# Patient Record
Sex: Female | Born: 1960 | Hispanic: No | Marital: Single | Smoking: Former smoker
Health system: Southern US, Community
[De-identification: ages and names within clinical notes are randomized; demographics above are authoritative.]

## PROBLEM LIST (undated history)

## (undated) DIAGNOSIS — E119 Type 2 diabetes mellitus without complications: Secondary | ICD-10-CM

## (undated) DIAGNOSIS — N840 Polyp of corpus uteri: Secondary | ICD-10-CM

## (undated) DIAGNOSIS — Z794 Long term (current) use of insulin: Secondary | ICD-10-CM

## (undated) DIAGNOSIS — I10 Essential (primary) hypertension: Secondary | ICD-10-CM

---

## 1980-04-22 HISTORY — PX: APPENDECTOMY: SHX54

## 1987-04-23 HISTORY — PX: CHOLECYSTECTOMY: SHX55

## 2017-07-01 ENCOUNTER — Ambulatory Visit (INDEPENDENT_AMBULATORY_CARE_PROVIDER_SITE_OTHER): Payer: Medicaid Other | Admitting: Family Medicine

## 2017-07-01 ENCOUNTER — Other Ambulatory Visit: Payer: Self-pay

## 2017-07-01 ENCOUNTER — Other Ambulatory Visit (HOSPITAL_COMMUNITY)
Admission: RE | Admit: 2017-07-01 | Discharge: 2017-07-01 | Disposition: A | Discharge status: Home / Self Care | Payer: Medicaid Other | Source: Ambulatory Visit | Attending: Family Medicine | Admitting: Family Medicine

## 2017-07-01 VITALS — BP 128/78 | HR 61 | Temp 98.5°F | Ht 66.5 in | Wt 220.6 lb

## 2017-07-01 DIAGNOSIS — G44219 Episodic tension-type headache, not intractable: Secondary | ICD-10-CM | POA: Diagnosis present

## 2017-07-01 DIAGNOSIS — R519 Headache, unspecified: Secondary | ICD-10-CM | POA: Insufficient documentation

## 2017-07-01 DIAGNOSIS — Z0289 Encounter for other administrative examinations: Secondary | ICD-10-CM | POA: Diagnosis not present

## 2017-07-01 DIAGNOSIS — R51 Headache: Secondary | ICD-10-CM

## 2017-07-01 NOTE — Assessment & Plan Note (Addendum)
Consistent with tension-type headache. Completely resolves with NSAIDs. No headache red flags. - Very low risk based on history and general exam.  Will conduct more full exam next week.  - Continue Ibuprofen and Tylenol as needed - Follow-up in 1 week
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
## 2017-07-01 NOTE — Assessment & Plan Note (Addendum)
Seen today and screening labs performed. Follow-up in 1 week for full physical exam and to review labs with patient in person.

## 2017-07-01 NOTE — Patient Instructions (Signed)
Fue Engineer, drilling conocerte! Para sus dolores de [place], puede continuar tomando ibuprofeno y tylenol segn sea necesario. Hicimos un trabajo de laboratorio hoy. Le haremos regresar en 1 semana para hacer un examen fsico completo y [employer] de esos laboratorios con usted.

## 2017-07-02 ENCOUNTER — Encounter: Payer: Self-pay | Admitting: Family Medicine

## 2017-07-02 LAB — URINE CYTOLOGY ANCILLARY ONLY
Chlamydia: NEGATIVE
Neisseria Gonorrhea: NEGATIVE

## 2017-07-03 LAB — URINE CULTURE

## 2017-07-05 LAB — COMPREHENSIVE METABOLIC PANEL
A/G RATIO: 1.6 (ref 1.2–2.2)
ALBUMIN: 4.4 g/dL (ref 3.5–5.5)
ALK PHOS: 112 IU/L (ref 39–117)
ALT: 29 IU/L (ref 0–32)
AST: 22 IU/L (ref 0–40)
BILIRUBIN TOTAL: 0.5 mg/dL (ref 0.0–1.2)
BUN / CREAT RATIO: 16 (ref 9–23)
BUN: 15 mg/dL (ref 6–24)
CO2: 24 mmol/L (ref 20–29)
CREATININE: 0.93 mg/dL (ref 0.57–1.00)
Calcium: 9.3 mg/dL (ref 8.7–10.2)
Chloride: 104 mmol/L (ref 96–106)
GFR calc Af Amer: 79 mL/min/{1.73_m2} (ref >59)
GFR calc non Af Amer: 68 mL/min/{1.73_m2} (ref >59)
GLOBULIN, TOTAL: 2.7 g/dL (ref 1.5–4.5)
Glucose: 87 mg/dL (ref 65–99)
POTASSIUM: 4.4 mmol/L (ref 3.5–5.2)
SODIUM: 141 mmol/L (ref 134–144)
Total Protein: 7.1 g/dL (ref 6.0–8.5)

## 2017-07-05 LAB — QUANTIFERON-TB GOLD PLUS
QUANTIFERON NIL VALUE: 0.03 [IU]/mL
QUANTIFERON-TB GOLD PLUS: NEGATIVE
QuantiFERON TB1 Ag Value: 0.02 IU/mL
QuantiFERON TB2 Ag Value: 0.02 IU/mL

## 2017-07-05 LAB — CBC WITH DIFFERENTIAL/PLATELET
BASOS: 1 %
Basophils Absolute: 0 10*3/uL (ref 0.0–0.2)
EOS (ABSOLUTE): 0.4 10*3/uL (ref 0.0–0.4)
Eos: 5 %
HEMATOCRIT: 40.3 % (ref 34.0–46.6)
Hemoglobin: 13.4 g/dL (ref 11.1–15.9)
Immature Grans (Abs): 0 10*3/uL (ref 0.0–0.1)
Immature Granulocytes: 0 %
LYMPHS ABS: 2.5 10*3/uL (ref 0.7–3.1)
Lymphs: 35 %
MCH: 29.1 pg (ref 26.6–33.0)
MCHC: 33.3 g/dL (ref 31.5–35.7)
MCV: 87 fL (ref 79–97)
MONOCYTES: 5 %
MONOS ABS: 0.4 10*3/uL (ref 0.1–0.9)
NEUTROS ABS: 3.9 10*3/uL (ref 1.4–7.0)
Neutrophils: 54 %
Platelets: 258 10*3/uL (ref 150–379)
RBC: 4.61 x10E6/uL (ref 3.77–5.28)
RDW: 14.5 % (ref 12.3–15.4)
WBC: 7.2 10*3/uL (ref 3.4–10.8)

## 2017-07-05 LAB — RPR: RPR Ser Ql: NONREACTIVE

## 2017-07-05 LAB — VARICELLA ZOSTER ANTIBODY, IGG: Varicella zoster IgG: 1941 index (ref >165)

## 2017-07-05 LAB — SICKLE CELL SCREEN: Sickle Cell Screen: NEGATIVE

## 2017-07-05 LAB — HIV ANTIBODY (ROUTINE TESTING W REFLEX): HIV SCREEN 4TH GENERATION: NONREACTIVE

## 2017-07-17 ENCOUNTER — Ambulatory Visit: Payer: Self-pay | Admitting: Internal Medicine

## 2017-07-31 ENCOUNTER — Encounter: Payer: Self-pay | Admitting: Internal Medicine

## 2017-07-31 ENCOUNTER — Ambulatory Visit (INDEPENDENT_AMBULATORY_CARE_PROVIDER_SITE_OTHER): Payer: Medicaid Other | Admitting: Internal Medicine

## 2017-07-31 ENCOUNTER — Other Ambulatory Visit (HOSPITAL_COMMUNITY)
Admission: RE | Admit: 2017-07-31 | Discharge: 2017-07-31 | Disposition: A | Discharge status: Home / Self Care | Payer: Medicaid Other | Source: Ambulatory Visit | Attending: Family Medicine | Admitting: Family Medicine

## 2017-07-31 ENCOUNTER — Other Ambulatory Visit: Payer: Self-pay

## 2017-07-31 VITALS — BP 130/78 | HR 78 | Temp 97.9°F | Wt 220.0 lb

## 2017-07-31 DIAGNOSIS — Z124 Encounter for screening for malignant neoplasm of cervix: Secondary | ICD-10-CM | POA: Diagnosis present

## 2017-07-31 DIAGNOSIS — K5900 Constipation, unspecified: Secondary | ICD-10-CM

## 2017-07-31 MED ORDER — SENNOSIDES-DOCUSATE SODIUM 8.6-50 MG PO TABS: 1.0000 | ORAL_TABLET | Freq: Two times a day (BID) | ORAL | 0 refills | Status: DC

## 2017-07-31 NOTE — Patient Instructions (Addendum)
Fue un Oceanographer. Hicimos tu prueba de [place]apan[place] Surveyor, mining) y te llamar con [employer]  Le recet un medicamento llamado Senokot-S para ayudarlo con sus deposiciones. Debe tomar 1 comprimido dos veces al da.   -Dr. Brett Albino

## 2017-07-31 NOTE — Progress Notes (Signed)
   [facility] Phone: [phone]  Subjective:  Tracie Burton is a 57 year old female presenting to clinic for pap and to discuss constipation.  Pap: Last pap smear was 4-5 years ago in [place]. Has never had an abnormal pap smear per patient. No vaginal discharge. Not sexually active.  Constipation: Has been going on for a couple of weeks. She is only having 1-2 BMs per week currently. She feels bloated and is having some lower abdominal "crampy" pain. She endorses straining. No hematochezia.  ROS: See HPI for pertinent positives and negatives  Past Medical History- hx headaches  Family history reviewed for today's visit. No changes.  Social history- recent refugee, patient is a former smoker  Objective: BP 130/78   Pulse 78   Temp 97.9 F (36.6 C) (Oral)   Wt 220 lb (99.8 kg)   SpO2 94%   BMI 34.98 kg/m  Gen: NAD, alert, cooperative with exam GI: soft, +mild tenderness in the LLQ, BS present, no guarding or organomegaly GU: External genitalia normal in appearance, no genital lesions, vaginal walls normal, small amount of white vaginal discharge present, cervix normal, no CMT, bimanual exam without any adnexal masses or tenderness  Assessment/Plan: Encounter for Pap: Pap performed today. Will call patient with results.  Constipation: Only having 1-2 BMs per day. No vomiting to suggest obstruction. No severe pain, rebound, or guarding to suggest serious intra-abdominal pathology. - Start Senokot-S bid, per patient request - Return precautions discussed - Follow-up if no improvement   Hyman Bible, MD PGY-3

## 2017-08-01 DIAGNOSIS — Z124 Encounter for screening for malignant neoplasm of cervix: Secondary | ICD-10-CM | POA: Insufficient documentation

## 2017-08-01 DIAGNOSIS — K5900 Constipation, unspecified: Secondary | ICD-10-CM | POA: Insufficient documentation

## 2017-08-01 LAB — CYTOLOGY - PAP
Adequacy: ABSENT
DIAGNOSIS: NEGATIVE
HPV (WINDOPATH): NOT DETECTED

## 2017-08-01 NOTE — Assessment & Plan Note (Signed)
Only having 1-2 BMs per day. No vomiting to suggest obstruction. No severe pain, rebound, or guarding to suggest serious intra-abdominal pathology. - Start Senokot-S bid, per patient request - Return precautions discussed - Follow-up if no improvement

## 2017-08-01 NOTE — Assessment & Plan Note (Signed)
Pap performed today. Will call patient with results.

## 2017-08-04 ENCOUNTER — Encounter: Payer: Self-pay | Admitting: *Deleted

## 2019-05-05 ENCOUNTER — Ambulatory Visit (INDEPENDENT_AMBULATORY_CARE_PROVIDER_SITE_OTHER): Payer: BC Managed Care – PPO

## 2019-05-05 ENCOUNTER — Other Ambulatory Visit: Payer: Self-pay

## 2019-05-05 ENCOUNTER — Ambulatory Visit (INDEPENDENT_AMBULATORY_CARE_PROVIDER_SITE_OTHER): Payer: BC Managed Care – PPO | Admitting: Family

## 2019-05-05 ENCOUNTER — Encounter: Payer: Self-pay | Admitting: Family

## 2019-05-05 VITALS — BP 132/78 | HR 67 | Temp 98.0°F | Ht 66.5 in | Wt 249.6 lb

## 2019-05-05 DIAGNOSIS — Z13220 Encounter for screening for lipoid disorders: Secondary | ICD-10-CM

## 2019-05-05 DIAGNOSIS — M549 Dorsalgia, unspecified: Secondary | ICD-10-CM

## 2019-05-05 DIAGNOSIS — R5383 Other fatigue: Secondary | ICD-10-CM

## 2019-05-05 DIAGNOSIS — M542 Cervicalgia: Secondary | ICD-10-CM | POA: Diagnosis not present

## 2019-05-05 DIAGNOSIS — R102 Pelvic and perineal pain unspecified side: Secondary | ICD-10-CM

## 2019-05-05 DIAGNOSIS — Z1231 Encounter for screening mammogram for malignant neoplasm of breast: Secondary | ICD-10-CM | POA: Diagnosis not present

## 2019-05-05 DIAGNOSIS — M545 Low back pain: Secondary | ICD-10-CM | POA: Diagnosis not present

## 2019-05-05 DIAGNOSIS — Z1211 Encounter for screening for malignant neoplasm of colon: Secondary | ICD-10-CM

## 2019-05-05 LAB — COMPREHENSIVE METABOLIC PANEL
ALT: 30 U/L (ref 0–35)
AST: 24 U/L (ref 0–37)
Albumin: 4.5 g/dL (ref 3.5–5.2)
Alkaline Phosphatase: 112 U/L (ref 39–117)
BUN: 15 mg/dL (ref 6–23)
CO2: 26 mEq/L (ref 19–32)
Calcium: 9.3 mg/dL (ref 8.4–10.5)
Chloride: 104 mEq/L (ref 96–112)
Creatinine, Ser: 0.76 mg/dL (ref 0.40–1.20)
GFR: 77.85 mL/min (ref >60.00)
Glucose, Bld: 133 mg/dL — ABNORMAL HIGH (ref 70–99)
Potassium: 4 mEq/L (ref 3.5–5.1)
Sodium: 139 mEq/L (ref 135–145)
Total Bilirubin: 0.7 mg/dL (ref 0.2–1.2)
Total Protein: 7.6 g/dL (ref 6.0–8.3)

## 2019-05-05 LAB — CBC WITH DIFFERENTIAL/PLATELET
Basophils Absolute: 0 10*3/uL (ref 0.0–0.1)
Basophils Relative: 0.3 % (ref 0.0–3.0)
Eosinophils Absolute: 0.5 10*3/uL (ref 0.0–0.7)
Eosinophils Relative: 5.8 % — ABNORMAL HIGH (ref 0.0–5.0)
HCT: 41.1 % (ref 36.0–46.0)
Hemoglobin: 13.5 g/dL (ref 12.0–15.0)
Lymphocytes Relative: 22.4 % (ref 12.0–46.0)
Lymphs Abs: 2.1 10*3/uL (ref 0.7–4.0)
MCHC: 32.7 g/dL (ref 30.0–36.0)
MCV: 88.8 fl (ref 78.0–100.0)
Monocytes Absolute: 0.4 10*3/uL (ref 0.1–1.0)
Monocytes Relative: 4.3 % (ref 3.0–12.0)
Neutro Abs: 6.2 10*3/uL (ref 1.4–7.7)
Neutrophils Relative %: 67.2 % (ref 43.0–77.0)
Platelets: 269 10*3/uL (ref 150.0–400.0)
RBC: 4.63 Mil/uL (ref 3.87–5.11)
RDW: 14.1 % (ref 11.5–15.5)
WBC: 9.2 10*3/uL (ref 4.0–10.5)

## 2019-05-05 LAB — LIPID PANEL
Cholesterol: 197 mg/dL (ref 0–200)
HDL: 60.1 mg/dL (ref >39.00)
LDL Cholesterol: 109 mg/dL — ABNORMAL HIGH (ref 0–99)
NonHDL: 136.81
Total CHOL/HDL Ratio: 3
Triglycerides: 137 mg/dL (ref 0.0–149.0)
VLDL: 27.4 mg/dL (ref 0.0–40.0)

## 2019-05-05 LAB — TSH: TSH: 0.92 u[IU]/mL (ref 0.35–4.50)

## 2019-05-05 LAB — VITAMIN D 25 HYDROXY (VIT D DEFICIENCY, FRACTURES): VITD: 23.31 ng/mL — ABNORMAL LOW (ref 30.00–100.00)

## 2019-05-05 MED ORDER — MELOXICAM 15 MG PO TABS: 15.0000 mg | ORAL_TABLET | Freq: Every day | ORAL | 0 refills | Status: DC

## 2019-05-05 NOTE — Progress Notes (Signed)
Tracie Burton is a 59 y.o. female with the following history as recorded in EpicCare:  Patient Active Problem List   Diagnosis Date Noted  . Screening for cervical cancer 08/01/2017  . Constipation 08/01/2017  . Headache 07/01/2017  . Refugee health examination 07/01/2017    Current Outpatient Medications  Medication Sig Dispense Refill  . meloxicam (MOBIC) 15 MG tablet Take 1 tablet (15 mg total) by mouth daily. 30 tablet 0  . senna-docusate (SENOKOT S) 8.6-50 MG tablet Take 1 tablet by mouth 2 (two) times daily. 180 tablet 0   No current facility-administered medications for this visit.    Allergies: Patient has no known allergies.  History reviewed. No pertinent past medical history.  Past Surgical History:  Procedure Laterality Date  . [place]  . CHOLECYSTECTOMY  1989    History reviewed. No pertinent family history.  Social History   Tobacco Use  . Smoking status: Former Research scientist (life sciences)  . Smokeless tobacco: Never Used  Substance Use Topics  . Alcohol use: Not on file    Subjective:  Patient presents today as a new patient; patient does not speak English/ speaks Spanish; visit is completed with assistance of translator; Complaining of upper back and lower back pain x 1 month; no numbness or tingling; job is physical in nature- works at [employer]; no known injury; Also complaining of pelvic pain- "feels like her stomach is very bloated." No vaginal spotting or bleeding; Last pap smear in 2019- normal; went through menopause at age 74; notes that appetite has been normal- has gained 30 pounds in 2 years; + chronic constipation but this is "not new for patient"- takes Senokot daily;  Overdue for mammogram; never had colonoscopy;   Objective:  Vitals:   05/05/19 1147  BP: 132/78  Pulse: 67  Temp: 98 F (36.7 C)  TempSrc: Oral  SpO2: 97%  Weight: 249 lb 9.6 oz (113.2 kg)  Height: 5' 6.5" (1.689 m)    General: Well developed, well  nourished, in no acute distress  Skin : Warm and dry.  Head: Normocephalic and atraumatic  Eyes: Sclera and conjunctiva clear; pupils round and reactive to light; extraocular movements intact  Ears: External normal; canals clear; tympanic membranes normal  Oropharynx: Pink, supple. No suspicious lesions  Neck: Supple without thyromegaly, adenopathy  Lungs: Respirations unlabored; clear to auscultation bilaterally without wheeze, rales, rhonchi  CVS exam: normal rate and regular rhythm.  Abdomen: Soft; nontender; nondistended; normoactive bowel sounds; no masses or hepatosplenomegaly  Musculoskeletal: No deformities; no active joint inflammation  Extremities: No edema, cyanosis, clubbing  Vessels: Symmetric bilaterally  Neurologic: Alert and oriented; speech intact; face symmetrical; moves all extremities well; CNII-XII intact without focal deficit   Assessment:  1. Back pain, unspecified back location, unspecified back pain laterality, unspecified chronicity   2. Screening mammogram, encounter for   3. Pelvic pain   4. Encounter for screening colonoscopy   5. Other fatigue   6. Lipid screening     Plan:  1. Update cervical and lumbar X-ray today; trial of Mobic 15 mg daily; may need to refer to sports medicine; 2. Order updated; 3. Order for pelvic ultrasound updated; pap smear up to date as of 2019; 4. Order updated; 5. Check labs including CBC, CMP, TSH, Vitamin D; 6. Check lipid panel today;   This visit occurred during the SARS-CoV-2 public health emergency.  Safety protocols were in place, including screening questions prior to the visit, additional usage of  staff PPE, and extensive cleaning of exam room while observing appropriate contact time as indicated for disinfecting solutions.     No follow-ups on file.  Orders Placed This Encounter  Procedures  . MM Digital Screening    Standing Status:   Future    Standing Expiration Date:   07/02/2020    Order Specific  Question:   Reason for Exam (SYMPTOM  OR DIAGNOSIS REQUIRED)    Answer:   screening mammogram    Order Specific Question:   Is the patient pregnant?    Answer:   No    Order Specific Question:   Preferred imaging location?    Answer:   [facility]  . DG Cervical Spine 2 or 3 views    Order Specific Question:   Reason for Exam (SYMPTOM  OR DIAGNOSIS REQUIRED)    Answer:   neck pain    Order Specific Question:   Is patient pregnant?    Answer:   No    Order Specific Question:   Preferred imaging location?    Answer:   [facility]    Order Specific Question:   Radiology Contrast Protocol - do NOT remove file path    Answer:   \\[email or web address]\DXFluoroContrastProtocols.pdf  . DG Lumbar Spine 2-3 Views    Order Specific Question:   Reason for Exam (SYMPTOM  OR DIAGNOSIS REQUIRED)    Answer:   low back pain    Order Specific Question:   Is patient pregnant?    Answer:   No    Order Specific Question:   Preferred imaging location?    Answer:   [facility]    Order Specific Question:   Radiology Contrast Protocol - do NOT remove file path    Answer:   \\[email or web address]\Radiant\DXFluoroContrastProtocols.pdf  . US Pelvic Complete With Transvaginal    Standing Status:   Future    Standing Expiration Date:   07/02/2020    Order Specific Question:   Reason for Exam (SYMPTOM  OR DIAGNOSIS REQUIRED)    Answer:   pelvic pain    Order Specific Question:   Preferred imaging location?    Answer:   [facility]  . CBC w/Diff  . Comp Met (CMET)  . Lipid panel  . TSH  . Vitamin D (25 hydroxy)  . Ambulatory referral to Gastroenterology    Referral Priority:   Routine    Referral Type:   Consultation    Referral Reason:   Specialty Services Required    Number of Visits Requested:   1    Requested Prescriptions   Signed Prescriptions Disp Refills  . meloxicam (MOBIC) 15 MG tablet 30 tablet 0    Sig: Take 1 tablet (15 mg total) by mouth daily.

## 2019-05-06 NOTE — Progress Notes (Signed)
Recommended to sports med

## 2019-05-18 ENCOUNTER — Encounter: Payer: Self-pay | Admitting: Family

## 2019-05-28 ENCOUNTER — Ambulatory Visit
Admission: RE | Admit: 2019-05-28 | Discharge: 2019-05-28 | Disposition: A | Discharge status: Home / Self Care | Payer: BC Managed Care – PPO | Source: Ambulatory Visit | Attending: Family | Admitting: Family

## 2019-05-28 DIAGNOSIS — N840 Polyp of corpus uteri: Secondary | ICD-10-CM | POA: Diagnosis not present

## 2019-05-28 DIAGNOSIS — R102 Pelvic and perineal pain: Secondary | ICD-10-CM

## 2019-05-31 ENCOUNTER — Other Ambulatory Visit: Payer: Self-pay | Admitting: Family

## 2019-05-31 DIAGNOSIS — N840 Polyp of corpus uteri: Secondary | ICD-10-CM

## 2019-05-31 DIAGNOSIS — R102 Pelvic and perineal pain: Secondary | ICD-10-CM

## 2019-06-15 ENCOUNTER — Encounter: Payer: Self-pay | Admitting: Family

## 2019-06-23 ENCOUNTER — Encounter: Payer: Self-pay | Admitting: Gastroenterology

## 2019-06-28 ENCOUNTER — Other Ambulatory Visit: Payer: Self-pay

## 2019-06-28 ENCOUNTER — Ambulatory Visit
Admission: RE | Admit: 2019-06-28 | Discharge: 2019-06-28 | Disposition: A | Discharge status: Home / Self Care | Payer: BC Managed Care – PPO | Source: Ambulatory Visit | Attending: Family | Admitting: Family

## 2019-06-28 DIAGNOSIS — Z1231 Encounter for screening mammogram for malignant neoplasm of breast: Secondary | ICD-10-CM

## 2019-07-19 ENCOUNTER — Ambulatory Visit: Payer: BC Managed Care – PPO | Attending: Internal Medicine

## 2019-07-19 DIAGNOSIS — Z23 Encounter for immunization: Secondary | ICD-10-CM

## 2019-07-19 NOTE — Progress Notes (Signed)
   Covid-19 Vaccination Clinic  Name:  Paylin Heavin    MRN: [id number] DOB: 03/07/1961  07/19/2019  Ms. Arleny Ramp was observed post Covid-19 immunization for 15 minutes without incident. She was provided with Vaccine Information Sheet and instruction to access the V-Safe system.   Ms. Nobuko Proto was instructed to call 911 with any severe reactions post vaccine: Marland Kitchen Difficulty breathing  . Swelling of face and throat  . A fast heartbeat  . A bad rash all over body  . Dizziness and weakness   Immunizations Administered    Name Date Dose VIS Date Route   JANSSEN COVID-19 VACCINE 07/19/2019  1:32 PM 0.5 mL 06/19/2019 Intramuscular   Manufacturer: Alphonsa Overall   Lot: [id number]   [place]: [phone]

## 2019-07-20 ENCOUNTER — Encounter: Payer: Self-pay | Admitting: Obstetrics and Gynecology

## 2019-07-20 ENCOUNTER — Other Ambulatory Visit: Payer: Self-pay

## 2019-07-20 ENCOUNTER — Ambulatory Visit: Payer: BC Managed Care – PPO | Admitting: Obstetrics and Gynecology

## 2019-07-20 VITALS — BP 122/78 | Ht 66.0 in | Wt 249.0 lb

## 2019-07-20 DIAGNOSIS — R1030 Lower abdominal pain, unspecified: Secondary | ICD-10-CM | POA: Diagnosis not present

## 2019-07-20 DIAGNOSIS — R140 Abdominal distension (gaseous): Secondary | ICD-10-CM | POA: Diagnosis not present

## 2019-07-20 DIAGNOSIS — K5900 Constipation, unspecified: Secondary | ICD-10-CM | POA: Diagnosis not present

## 2019-07-20 DIAGNOSIS — N898 Other specified noninflammatory disorders of vagina: Secondary | ICD-10-CM

## 2019-07-20 DIAGNOSIS — N9489 Other specified conditions associated with female genital organs and menstrual cycle: Secondary | ICD-10-CM | POA: Diagnosis not present

## 2019-07-20 NOTE — Patient Instructions (Signed)
por favor programe una cita para regresar a la clnica cuando haya revisado su horario de [place]

## 2019-07-20 NOTE — Progress Notes (Signed)
Tracie Burton  12-08-1960 [id number]  HPI The patient is a 59 y.o. [id number] who presents today for findings of a possible endometrial polyp (2 x 2 x 3 mm) with a 3 mm endometrial thickness on recent elective ultrasound from 05/28/2019.  Bilateral ovaries were not visualized.  There is trace pelvic free fluid, no adnexal masses appreciated.  She was sent for the ultrasound at her primary care provider due to pelvic pain, bloating, and vaginal discharge.  She has had a creamy white vaginal discharge with a lot of moisture noted in her undergarments in the past 3-4 months.  Occasionally has vaginal itching and irritation.  Her bloating and lower abdominal pain has been present in the last 3 months or so.  She feels as though her stomach is moving constantly with rumbling and internal movement causing some discomfort.  Tylenol helps a little, bloating and discomfort is a bit relieved by passing gas, resting, or having a bowel movement.  She is not having a daily bowel movement, she does find help with constipation by using Senokot.  The bloating bloating and discomfort do not have any relation to eating.  She denies any vaginal bleeding or recurrent vaginal discharge since menopause started around age 29.  She not use any hormones in postmenopausal.  She denies any prior abnormal Pap smears.  2 vaginal deliveries, 3 cesarean sections (1 child unfortunately passed away). Spanish interpreter present for the visit today.  Past medical history,surgical history, problem list, medications, allergies, family history and social history were all reviewed and documented as reviewed in the EPIC chart.  ROS:  Feeling well. No dyspnea or chest pain on exertion.  No abdominal pain, change in bowel habits, black or bloody stools.  No urinary tract symptoms. GYN ROS: no abnormal bleeding, pelvic pain, +vaginal discharge.  Physical Exam  BP 122/78   Ht 5\' 6"  (1.676 m)   Wt 249 lb (112.9 kg)   BMI 40.19 kg/m    General: Pleasant female, no acute distress, alert and oriented PELVIC EXAM: Deferred to future visit per patient request  Pelvic ultrasound images were independently reviewed.  Assessment 59 yo [id number] menopausal female with incidental finding of a possible endometrial polyp, vaginal discharge, and abdominal bloating  Plan I used diagrams to illustrate the finding of the endometrial polyp.  Endometrial polyps do not cause abdominal pain or bloating unless they are related to a larger global condition (e.g. malignancy).  It does sound like she has some bowel dysfunction and a constipation issue, so I think it is more likely that her bloating is related to GI causes than anything gynecologic.  She has not had any vaginal bleeding and her endometrial thickness was only 3 mm, but she did have a significant amount of endometrial fluid present on the imaging.  We discussed that her ovaries are not well visualized on the ultrasound.  There was only trace pelvic free fluid.  Will try to get a sense on pelvic exam to see if there is any sign of adnexal fullness or tenderness.  We certainly will plan to do the pelvic examination at a future date, but I would also recommend performing endometrial biopsy since she is noted to have the polyp and endometrial fluid on ultrasound, even though she has not had postmenopausal bleeding.  Since the polyp was discovered it would be reasonable to perform the biopsy just to give more reassurance that the endometrial tissue is benign, and I explained that blind endometrial biopsy does  not reliably sample polyp tissue, but it is a good happy medium between no further evaluation and performing a full D&C.  It also would be good to perform a vaginal wet prep since she is having vaginal discharge.  We will can do this all at the same time.  She preferred to first look at her work schedule so she can return another day to have the pelvic exam and endometrial biopsy performed.  I  recommend taking 600 mg ibuprofen prior to the procedure.  She will let us know when she can return for that visit.  All questions were answered by the end of the visit.  Joseph Pierini MD, FACOG 07/20/19

## 2019-07-27 ENCOUNTER — Encounter: Payer: BC Managed Care – PPO | Admitting: Gastroenterology

## 2019-08-31 ENCOUNTER — Encounter: Payer: Self-pay | Admitting: Obstetrics and Gynecology

## 2019-08-31 ENCOUNTER — Other Ambulatory Visit: Payer: Self-pay

## 2019-08-31 ENCOUNTER — Ambulatory Visit (INDEPENDENT_AMBULATORY_CARE_PROVIDER_SITE_OTHER): Payer: BC Managed Care – PPO | Admitting: Obstetrics and Gynecology

## 2019-08-31 VITALS — BP 120/76

## 2019-08-31 DIAGNOSIS — R1030 Lower abdominal pain, unspecified: Secondary | ICD-10-CM

## 2019-08-31 DIAGNOSIS — N859 Noninflammatory disorder of uterus, unspecified: Secondary | ICD-10-CM | POA: Diagnosis not present

## 2019-08-31 DIAGNOSIS — N898 Other specified noninflammatory disorders of vagina: Secondary | ICD-10-CM | POA: Diagnosis not present

## 2019-08-31 DIAGNOSIS — N9489 Other specified conditions associated with female genital organs and menstrual cycle: Secondary | ICD-10-CM | POA: Diagnosis not present

## 2019-08-31 LAB — WET PREP FOR TRICH, YEAST, CLUE

## 2019-08-31 NOTE — Addendum Note (Signed)
Addended by: Joseph Pierini D on: 08/31/2019 04:36 PM   Modules accepted: Orders

## 2019-08-31 NOTE — Progress Notes (Signed)
Tracie Burton 06/11/1959 [id number]  SUBJECTIVE:  59 y.o. [id number] female presents for an endometrial biopsy and follow-up to a pelvic ultrasound that was performed 05/28/2019 for pelvic pain, bloating, and vaginal discharge.  That ultrasound indicated she had a 3 mm endometrial thickness with a 3 mm endometrial polyp.  Her ovaries were not visualized.  She had endometrial fluid present, and she also complains of a fair amount of clearish vaginal fluid that causes moisture in her undergarments.  Returns today for a pelvic exam and endometrial biopsy due to the ultrasound findings.  She has not had any vaginal bleeding at any point.  A professional Spanish interpreter is present for today's encounter.  Current Outpatient Medications  Medication Sig Dispense Refill  . Multiple Vitamin (MULTIVITAMIN) tablet Take 1 tablet by mouth daily.     No current facility-administered medications for this visit.   Allergies: Patient has no known allergies.  No LMP recorded. Patient is postmenopausal.  Past medical history,surgical history, problem list, medications, allergies, family history and social history were all reviewed and documented as reviewed in the EPIC chart.  ROS:  Feeling well. No dyspnea or chest pain on exertion.  No abdominal pain, change in bowel habits, black or bloody stools.  No urinary tract symptoms. GYN ROS: As described in HPI   OBJECTIVE:  BP 120/76  The patient appears well, alert, oriented x 3, in no distress. PELVIC EXAM: VULVA: normal appearing vulva with no masses, tenderness or lesions, atrophic changes noted, VAGINA: Long Peterson speculum worked best to view the cervix, normal appearing vagina with normal color and discharge, no lesions, CERVIX: normal appearing cervix without discharge or lesions, UTERUS: uterus is normal size, shape, consistency and nontender, ADNEXA: normal adnexa in size, nontender and no masses, WET MOUNT done - results: negative for  pathogens, normal epithelial cells, few bacteria   Endometrial Biopsy Procedure Note Tracie Burton 16-Jun-1959 [id number] The cervix was cleansed with a Betadine swab x1.  The anterior lip of the cervix was grasped with an Allis clamp.  There was some stenosis to the external cervical os which was easily released with the cervical os finder.  The endometrial Pipelle sampler was easily introduced into the endometrial cavity and sound length measurement was taken (8 cm).  The plunger was withdrawn causing suction in the Pipelle and the device was moved about the cavity for about 10 sec.  The Pipelle was removed and the first sample only contained a cloudy, whitish/light yellow nonpurulent fluid.  The sample was emptied in a specimen cup, maintaining sterility.  The Pipelle was reintroduced into the endometrial cavity to collect a scant amount of endometrial tissue sample.  The specimen contents were collected and sent to pathology for analysis.  The patient tolerated the procedure well without any complication.  Caryn Bee present during the procedure and examination   ASSESSMENT:  59 y.o. [id number] here for endometrial biopsy for suspected endometrial polyp, also with ongoing clear vaginal discharge  PLAN:  Vaginal wet mount: Negative. Endometrial biopsy was collected today without difficulty.  We will let her know the results of today's biopsy when pathology report is available.  She had a normal Pap smear 07/2017. Using diagrams, I explained that she had a fair amount of endometrial fluid present which was drained by performing today's procedure which did release the scarring of the external cervical os.  This may fix her problem of the vaginal discharge that she has noticed.  I reassured her that she had  just a scant amount of endometrium present in the sample and therefore I do not suspect that she has any abnormal process going on in the uterus.  She should take ibuprofen and Tylenol for  cramping.  All questions were answered by the end of the visit.  Joseph Pierini MD 08/31/19

## 2019-08-31 NOTE — Patient Instructions (Signed)
Take ibuprofen for cramping 400-600 mg with food every 6 hours as needed Take acetaminophen for pain [phone] mg every 6 hours as needed We will call you with your biopsy results

## 2019-09-02 LAB — TISSUE SPECIMEN

## 2019-09-02 LAB — PATHOLOGY REPORT

## 2019-09-02 NOTE — Progress Notes (Signed)
Please let Seleta know that the endometrial biopsy showed normal tissue, no sign of precancerous or cancer tissue.  Should let us know if she does at any point develop vaginal bleeding.

## 2019-10-08 ENCOUNTER — Ambulatory Visit: Payer: BC Managed Care – PPO | Admitting: Family

## 2019-10-08 ENCOUNTER — Other Ambulatory Visit: Payer: Self-pay

## 2019-10-08 ENCOUNTER — Encounter: Payer: Self-pay | Admitting: Family

## 2019-10-08 VITALS — BP 136/82 | HR 80 | Temp 98.0°F | Ht 66.0 in | Wt 233.0 lb

## 2019-10-08 DIAGNOSIS — R7309 Other abnormal glucose: Secondary | ICD-10-CM

## 2019-10-08 DIAGNOSIS — B379 Candidiasis, unspecified: Secondary | ICD-10-CM

## 2019-10-08 LAB — HEMOGLOBIN A1C: Hgb A1c MFr Bld: 13.6 % — ABNORMAL HIGH (ref 4.6–6.5)

## 2019-10-08 LAB — CBC WITH DIFFERENTIAL/PLATELET
Basophils Absolute: 0.1 10*3/uL (ref 0.0–0.1)
Basophils Relative: 1 % (ref 0.0–3.0)
Eosinophils Absolute: 0.2 10*3/uL (ref 0.0–0.7)
Eosinophils Relative: 3 % (ref 0.0–5.0)
HCT: 40.1 % (ref 36.0–46.0)
Hemoglobin: 13.3 g/dL (ref 12.0–15.0)
Lymphocytes Relative: 24.9 % (ref 12.0–46.0)
Lymphs Abs: 1.7 10*3/uL (ref 0.7–4.0)
MCHC: 33.3 g/dL (ref 30.0–36.0)
MCV: 88.8 fl (ref 78.0–100.0)
Monocytes Absolute: 0.4 10*3/uL (ref 0.1–1.0)
Monocytes Relative: 5.2 % (ref 3.0–12.0)
Neutro Abs: 4.6 10*3/uL (ref 1.4–7.7)
Neutrophils Relative %: 65.9 % (ref 43.0–77.0)
Platelets: 226 10*3/uL (ref 150.0–400.0)
RBC: 4.51 Mil/uL (ref 3.87–5.11)
RDW: 13.5 % (ref 11.5–15.5)
WBC: 7 10*3/uL (ref 4.0–10.5)

## 2019-10-08 LAB — COMPREHENSIVE METABOLIC PANEL
ALT: 29 U/L (ref 0–35)
AST: 22 U/L (ref 0–37)
Albumin: 4.2 g/dL (ref 3.5–5.2)
Alkaline Phosphatase: 137 U/L — ABNORMAL HIGH (ref 39–117)
BUN: 20 mg/dL (ref 6–23)
CO2: 26 mEq/L (ref 19–32)
Calcium: 9 mg/dL (ref 8.4–10.5)
Chloride: 102 mEq/L (ref 96–112)
Creatinine, Ser: 1.01 mg/dL (ref 0.40–1.20)
GFR: 55.99 mL/min — ABNORMAL LOW (ref >60.00)
Glucose, Bld: 486 mg/dL — ABNORMAL HIGH (ref 70–99)
Potassium: 3.9 mEq/L (ref 3.5–5.1)
Sodium: 134 mEq/L — ABNORMAL LOW (ref 135–145)
Total Bilirubin: 0.8 mg/dL (ref 0.2–1.2)
Total Protein: 6.8 g/dL (ref 6.0–8.3)

## 2019-10-08 MED ORDER — NYSTATIN-TRIAMCINOLONE 100000-0.1 UNIT/GM-% EX CREA: 1.0000 "application " | TOPICAL_CREAM | Freq: Two times a day (BID) | CUTANEOUS | 0 refills | Status: DC

## 2019-10-08 MED ORDER — FLUCONAZOLE 100 MG PO TABS
100.0000 mg | ORAL_TABLET | Freq: Every day | ORAL | 0 refills | Status: DC
Start: 2019-10-08 — End: 2019-10-20

## 2019-10-08 NOTE — Progress Notes (Signed)
  Tracie Burton is a 59 y.o. female with the following history as recorded in EpicCare:  Patient Active Problem List   Diagnosis Date Noted  . Screening for cervical cancer 08/01/2017  . Constipation 08/01/2017  . Headache 07/01/2017  . Refugee health examination 07/01/2017    Current Outpatient Medications  Medication Sig Dispense Refill  . Multiple Vitamin (MULTIVITAMIN) tablet Take 1 tablet by mouth daily.    . fluconazole (DIFLUCAN) 100 MG tablet Take 1 tablet (100 mg total) by mouth daily. 7 tablet 0  . nystatin-triamcinolone (MYCOLOG II) cream Apply 1 application topically 2 (two) times daily. 60 g 0   No current facility-administered medications for this visit.    Allergies: Patient has no known allergies.  History reviewed. No pertinent past medical history.  Past Surgical History:  Procedure Laterality Date  . APPENDECTOMY    . [place]  . CHOLECYSTECTOMY  1989    Family History  Problem Relation Age of Onset  . Breast cancer Mother 79  . Diabetes Father     Social History   Tobacco Use  . Smoking status: Former Research scientist (life sciences)  . Smokeless tobacco: Never Used  Substance Use Topics  . Alcohol use: Never    Subjective:  Accompanied by daughter who serves as Optometrist; complaining of rash in groin area/ vaginal itching; symptoms present x 2-3 weeks; is menopausal- no unexplained bleeding or vaginal discharge; no OTC medications;   Objective:  Vitals:   10/08/19 1515  BP: 136/82  Pulse: 80  Temp: 98 F (36.7 C)  TempSrc: Oral  SpO2: 96%  Weight: 233 lb (105.7 kg)  Height: '5\' 6"'$  (1.676 m)    General: Well developed, well nourished, in no acute distress  Skin : Warm and dry.  Head: Normocephalic and atraumatic  Lungs: Respirations unlabored; Neurologic: Alert and oriented; speech intact; face symmetrical; moves all extremities well; CNII-XII intact without focal deficit  Pelvic exam- erythema noted in inguinal areas c/w  yeast  Assessment:  1. Yeast infection   2. Elevated glucose     Plan:  Rx for Diflucan 100 mg qd x 7 days; Rx for Mycolog II cream- apply bid to affected area; Glucose was elevated at OV in January- will repeat today and check hgba1c- may need to consider trial of Metformin;  This visit occurred during the SARS-CoV-2 public health emergency.  Safety protocols were in place, including screening questions prior to the visit, additional usage of staff PPE, and extensive cleaning of exam room while observing appropriate contact time as indicated for disinfecting solutions.     No follow-ups on file.  Orders Placed This Encounter  Procedures  . HgB A1c  . CBC with Differential/Platelet  . Comp Met (CMET)    Requested Prescriptions   Signed Prescriptions Disp Refills  . fluconazole (DIFLUCAN) 100 MG tablet 7 tablet 0    Sig: Take 1 tablet (100 mg total) by mouth daily.  Marland Kitchen nystatin-triamcinolone (MYCOLOG II) cream 60 g 0    Sig: Apply 1 application topically 2 (two) times daily.

## 2019-10-08 NOTE — Patient Instructions (Addendum)
Infecciones por hongos en la piel Skin Yeast Infection  La infeccin por hongos en la piel es un trastorno en el que hay un desarrollo excesivo de unos hongos (cndida) que viven normalmente en la piel. Por lo general, este tipo de infeccin ocurre en reas de la piel que estn constantemente clidas y hmedas, como las axilas o la ingle. Cules son las causas? La causa de la afeccin es un cambio en el equilibrio normal de los hongos y las bacterias que viven en la piel. Qu incrementa el riesgo? Es ms probable que tenga esta afeccin si:  Tiene obesidad.  Est embarazada.  Toma anticonceptivos orales.  Tiene diabetes.  Toma antibiticos.  Toma medicamentos con corticoesteroides.  Est desnutrido.  Tiene debilitado el sistema de defensa del organismo (sistema inmunitario).  Tiene 65aos o ms.  [place] ropa ajustada. Cules son los signos o los sntomas? El sntoma ms frecuente de esta afeccin es picazn en la zona afectada. Otros sntomas pueden incluir los siguientes:  Zona de la piel roja e hinchada.  Bultos en la piel. Cmo se diagnostica?  Esta afeccin se diagnostica mediante una revisin de los antecedentes mdicos y un examen fsico.  El mdico puede raspar ligeramente la piel para tomar Truddie Coco y analizarla con un microscopio a fin de Office manager presencia de hongos. Cmo se trata? Esta afeccin se trata con medicamentos. [place] pueden ser de venta libre o con receta. Estos medicamentos pueden administrarse de la siguiente manera:  Por boca (va oral).  Mediante aplicacin en la piel, en forma de crema o polvo. Siga estas indicaciones en su casa:   Tome o aplquese los medicamentos de venta libre y los recetados solamente como se lo haya indicado el mdico.  Mantenga un peso saludable. Si necesita ayuda para bajar de peso, hable con el mdico.  Mantenga la piel limpia y [place].  Si tiene diabetes, mantenga bajo control el nivel de  Dispensing optician.  Concurra a todas las visitas de control como se lo haya indicado el mdico. Esto es importante. Comunquese con un mdico si:  Los sntomas desaparecen y luego vuelven a Arts administrator.  Los sntomas no mejoran con Dispensing optician.  Sus sntomas empeoran.  La erupcin se extiende.  Tiene fiebre o escalofros.  Aparecen nuevos sntomas.  Tiene una nueva zona de enrojecimiento o calor en la piel. Resumen  La infeccin por hongos en la piel es un trastorno en el que hay un desarrollo excesivo de unos hongos (cndida) que viven normalmente en la piel. La causa de la afeccin es un cambio en el equilibrio normal de los hongos y las bacterias que viven en la piel.  Tome o aplquese los medicamentos de venta libre y los recetados solamente como se lo haya indicado el mdico.  Mantenga la piel limpia y [place].  Comunquese con un mdico si los sntomas no mejoran con el tratamiento. Esta informacin no tiene Marine scientist el consejo del mdico. Asegrese de hacerle al mdico cualquier pregunta que tenga. Document Revised: 10/16/2017 Document Reviewed: 10/16/2017 Elsevier Patient Education  [place].

## 2019-10-11 ENCOUNTER — Other Ambulatory Visit: Payer: Self-pay | Admitting: Family

## 2019-10-11 DIAGNOSIS — E119 Type 2 diabetes mellitus without complications: Secondary | ICD-10-CM

## 2019-10-11 MED ORDER — METFORMIN HCL 500 MG PO TABS
500.0000 mg | ORAL_TABLET | Freq: Two times a day (BID) | ORAL | 1 refills | Status: DC
Start: 2019-10-11 — End: 2020-04-28

## 2019-10-11 MED ORDER — TRULICITY 0.75 MG/0.5ML ~~LOC~~ SOAJ
0.7500 mg | SUBCUTANEOUS | 0 refills | Status: DC
Start: 2019-10-11 — End: 2019-11-11

## 2019-10-20 ENCOUNTER — Encounter: Payer: Self-pay | Admitting: Family

## 2019-10-20 ENCOUNTER — Other Ambulatory Visit: Payer: Self-pay

## 2019-10-20 ENCOUNTER — Ambulatory Visit: Payer: BC Managed Care – PPO | Admitting: Family

## 2019-10-20 VITALS — BP 126/72 | HR 90 | Temp 98.6°F | Wt 229.4 lb

## 2019-10-20 DIAGNOSIS — E119 Type 2 diabetes mellitus without complications: Secondary | ICD-10-CM

## 2019-10-20 NOTE — Progress Notes (Signed)
  Tracie Burton is a 59 y.o. female with the following history as recorded in EpicCare:  Patient Active Problem List   Diagnosis Date Noted  . Screening for cervical cancer 08/01/2017  . Constipation 08/01/2017  . Headache 07/01/2017  . Refugee health examination 07/01/2017    Current Outpatient Medications  Medication Sig Dispense Refill  . Dulaglutide (TRULICITY) 3.66 YQ/0.3KV SOPN Inject 0.5 mLs (0.75 mg total) into the skin once a week. 4 pen 0  . metFORMIN (GLUCOPHAGE) 500 MG tablet Take 1 tablet (500 mg total) by mouth 2 (two) times daily with a meal. 60 tablet 1  . Multiple Vitamin (MULTIVITAMIN) tablet Take 1 tablet by mouth daily.    Marland Kitchen nystatin-triamcinolone (MYCOLOG II) cream Apply 1 application topically 2 (two) times daily. 60 g 0   No current facility-administered medications for this visit.    Allergies: Patient has no known allergies.  No past medical history on file.  Past Surgical History:  Procedure Laterality Date  . APPENDECTOMY    . [place]  . CHOLECYSTECTOMY  1989    Family History  Problem Relation Age of Onset  . Breast cancer Mother 25  . Diabetes Father     Social History   Tobacco Use  . Smoking status: Former Research scientist (life sciences)  . Smokeless tobacco: Never Used  Substance Use Topics  . Alcohol use: Never    Subjective:  Accompanied by translator; follow-up on recent diagnosis of Type 2 Diabetes; tolerating Metformin well- has been taking bid; needs teaching on how to use Trulicity; Yeast infection has cleared completely that she had at last OV;    Objective:  Vitals:   10/20/19 1301  BP: 126/72  Pulse: 90  Temp: 98.6 F (37 C)  TempSrc: Oral  SpO2: 95%    General: Well developed, well nourished, in no acute distress  Skin : Warm and dry.  Lungs: Respirations unlabored; clear to auscultation bilaterally without wheeze, rales, rhonchi  Neurologic: Alert and oriented; speech intact; face symmetrical; moves  all extremities well; CNII-XII intact without focal deficit   Assessment:  1. Type 2 diabetes mellitus without complication, without long-term current use of insulin (HCC)     Plan:  Non fasting glucose today is 276; improving from last check at 425; Trulicity injection is given in office- patient is comfortable with giving herself her injections going forward; keep Metformin at bid;  She will go to diabetic education in 2 weeks; office visit here in 1 month;  This visit occurred during the SARS-CoV-2 public health emergency.  Safety protocols were in place, including screening questions prior to the visit, additional usage of staff PPE, and extensive cleaning of exam room while observing appropriate contact time as indicated for disinfecting solutions.     Return in about 1 month (around 11/19/2019).  No orders of the defined types were placed in this encounter.   Requested Prescriptions    No prescriptions requested or ordered in this encounter

## 2019-11-03 ENCOUNTER — Encounter: Payer: BC Managed Care – PPO | Attending: Family | Admitting: Nutrition

## 2019-11-03 ENCOUNTER — Other Ambulatory Visit: Payer: Self-pay

## 2019-11-03 DIAGNOSIS — E1165 Type 2 diabetes mellitus with hyperglycemia: Secondary | ICD-10-CM | POA: Diagnosis not present

## 2019-11-04 NOTE — Progress Notes (Signed)
Patient is here with interprter:Mr. Jemendez to review her diet. She is newly diagnosed, and does not test blood sugars.  Says since seeing her  MD, she has reduced portion sizes, stopped drinking sweet drinks, and juices, and has lost weight--says has lost 26 pounds! Diet: Typical day: 5AM:  Coffee with spenda 7AM: at work 2 eggs, with little bit of cheese or oatmeal with fruit and water to drink 2PM: chicken with onions, garlic, pimentoes, tomatoes, and other veg. As she gets them, rice 91cup), potaoes (1/2 cup. Water to drink 4PM: small amount of Panella (9this is a sweet natural vegetable that she eats with small amount of cheese. 6PM: smaller meal of eggs with bread, or small chicken breast sandwich with lettuce and tomatoe, or oatmeal with apple slices  With water to drink 9PM: croissant, or angle food cake slice with water to drink.  Exercise:  None. SBGM: none Discussion 1.  Discussed diabetes-what is happening in her body, and ways she can help decrease insulin resisitace with diet, exercise, and weight loss. 2.  Discussed importance of diet, and balancing meals-making sure there is protein ,carbs and fat in each meal, but not too much of any one.  Sheet given with foods in each group, an told her that she is actually doing this now.  Questioned meal of oatmeal with fruit, and that need for some protein.  Suggestions given for protein choices for this meals 3.  Discussed importance of testing blood sugar to determine if diet/blood sugar rise is handled by body.  Suggested she test around 1 meal each day-testing before and 2hr. After one meal each week.  Goals given for this.  Accucheck guide given with sample of 10 test strips given.  Lot # [id number], exp. Date; 01/11/21.  She redemonstrated how to use this meter, and her blood sugar was 121 fasting.   She was told to call office to send in script for these--50/month.  She agreed to do this.

## 2019-11-04 NOTE — Patient Instructions (Signed)
Test blood sugar around one meal each day--before and 2hr. After finishing the meal- Do one meal--breakfast, lunch and supper each week.  If 2hr. After meal blood sugar is over 180, write down what you ate and bring this with you in one month.   Walk for 20 min. 5 days per week, and increase this as tolerated to 30 min.  Continue currant meal plan, except add once ounce of protein to oatmeal meal.

## 2019-11-11 ENCOUNTER — Other Ambulatory Visit: Payer: Self-pay | Admitting: Family

## 2019-11-15 ENCOUNTER — Ambulatory Visit: Payer: BC Managed Care – PPO | Admitting: Family

## 2019-11-15 ENCOUNTER — Other Ambulatory Visit: Payer: Self-pay

## 2019-11-15 ENCOUNTER — Encounter: Payer: Self-pay | Admitting: Family

## 2019-11-15 VITALS — BP 112/80 | HR 77 | Temp 98.6°F | Wt 225.6 lb

## 2019-11-15 DIAGNOSIS — E119 Type 2 diabetes mellitus without complications: Secondary | ICD-10-CM | POA: Diagnosis not present

## 2019-11-15 NOTE — Patient Instructions (Signed)
Hold the Metformin; just use Trulicity; we'll adjust the dose if needed after I see the labs; plan to 3-4 month follow-up;

## 2019-11-15 NOTE — Addendum Note (Signed)
Addended by: Cresenciano Lick on: 11/15/2019 04:15 PM   Modules accepted: Orders

## 2019-11-15 NOTE — Progress Notes (Signed)
  Tracie Burton is a 59 y.o. female with the following history as recorded in EpicCare:  Patient Active Problem List   Diagnosis Date Noted  . Screening for cervical cancer 08/01/2017  . Constipation 08/01/2017  . Headache 07/01/2017  . Refugee health examination 07/01/2017    Current Outpatient Medications  Medication Sig Dispense Refill  . Multiple Vitamin (MULTIVITAMIN) tablet Take 1 tablet by mouth daily.    Marland Kitchen nystatin-triamcinolone (MYCOLOG II) cream Apply 1 application topically 2 (two) times daily. 60 g 0  . metFORMIN (GLUCOPHAGE) 500 MG tablet Take 1 tablet (500 mg total) by mouth 2 (two) times daily with a meal. (Patient not taking: Reported on 11/15/2019) 60 tablet 1  . TRULICITY 7.82 NF/6.2ZH SOPN ADMINISTER 0.75 MG UNDER THE SKIN 1 TIME A WEEK (Patient not taking: Reported on 11/15/2019) 2 mL 0   No current facility-administered medications for this visit.    Allergies: Patient has no known allergies.  No past medical history on file.  Past Surgical History:  Procedure Laterality Date  . APPENDECTOMY    . [place]  . CHOLECYSTECTOMY  1989    Family History  Problem Relation Age of Onset  . Breast cancer Mother 3  . Diabetes Father     Social History   Tobacco Use  . Smoking status: Former Research scientist (life sciences)  . Smokeless tobacco: Never Used  Substance Use Topics  . Alcohol use: Never    Subjective:   Accompanied by daughter who helps translate today; has lost 24 pounds in the past 4 months; did find her diabetic class very useful; is concerned that her Metformin is causing her chronic nausea; has not had any problems tolerating Trulicity;   Objective:  Vitals:   11/15/19 1541  BP: 112/80  Pulse: 77  Temp: 98.6 F (37 C)  TempSrc: Oral  SpO2: 95%  Weight: (!) 225 lb 9.6 oz (102.3 kg)    General: Well developed, well nourished, in no acute distress  Skin : Warm and dry.  Head: Normocephalic and atraumatic  Lungs:  Respirations unlabored; clear to auscultation bilaterally without wheeze, rales, rhonchi  CVS exam: normal rate and regular rhythm.  Neurologic: Alert and oriented; speech intact; face symmetrical; moves all extremities well; CNII-XII intact without focal deficit   Assessment:  1. Type 2 diabetes mellitus without complication, without long-term current use of insulin (HCC)     Plan:  Hold the Metformin for now; update labs today including CMP, Hgba1c; will plan to continue Trulicity and will adjust dosage if needed based on labs today; will also need to consider starting statin; will discus with patient once labs are back; plan for OV in 3-4 months;  This visit occurred during the SARS-CoV-2 public health emergency.  Safety protocols were in place, including screening questions prior to the visit, additional usage of staff PPE, and extensive cleaning of exam room while observing appropriate contact time as indicated for disinfecting solutions.     No follow-ups on file.  Orders Placed This Encounter  Procedures  . Comp Met (CMET)    Standing Status:   Future    Standing Expiration Date:   11/14/2020  . Hemoglobin A1c    Standing Status:   Future    Standing Expiration Date:   11/14/2020    Requested Prescriptions    No prescriptions requested or ordered in this encounter

## 2019-11-16 ENCOUNTER — Other Ambulatory Visit: Payer: Self-pay | Admitting: Family

## 2019-11-16 LAB — COMPREHENSIVE METABOLIC PANEL
AG Ratio: 2 (calc) (ref 1.0–2.5)
ALT: 24 U/L (ref 6–29)
AST: 24 U/L (ref 10–35)
Albumin: 4.7 g/dL (ref 3.6–5.1)
Alkaline phosphatase (APISO): 111 U/L (ref 37–153)
BUN: 14 mg/dL (ref 7–25)
CO2: 29 mmol/L (ref 20–32)
Calcium: 9.3 mg/dL (ref 8.6–10.4)
Chloride: 105 mmol/L (ref 98–110)
Creat: 0.9 mg/dL (ref 0.50–1.05)
Globulin: 2.4 g/dL (calc) (ref 1.9–3.7)
Glucose, Bld: 100 mg/dL — ABNORMAL HIGH (ref 65–99)
Potassium: 4.6 mmol/L (ref 3.5–5.3)
Sodium: 141 mmol/L (ref 135–146)
Total Bilirubin: 0.7 mg/dL (ref 0.2–1.2)
Total Protein: 7.1 g/dL (ref 6.1–8.1)

## 2019-11-16 LAB — HEMOGLOBIN A1C
Hgb A1c MFr Bld: 11.3 % of total Hgb — ABNORMAL HIGH (ref <5.7)
Mean Plasma Glucose: 278 (calc)
eAG (mmol/L): 15.4 (calc)

## 2019-11-16 MED ORDER — TRULICITY 1.5 MG/0.5ML ~~LOC~~ SOAJ
1.5000 mg | SUBCUTANEOUS | 3 refills | Status: DC
Start: 2019-11-16 — End: 2020-03-24

## 2019-12-14 ENCOUNTER — Other Ambulatory Visit: Payer: Self-pay | Admitting: Family

## 2020-02-21 ENCOUNTER — Ambulatory Visit: Payer: BC Managed Care – PPO | Admitting: Family

## 2020-02-21 DIAGNOSIS — Z0289 Encounter for other administrative examinations: Secondary | ICD-10-CM

## 2020-03-24 ENCOUNTER — Other Ambulatory Visit: Payer: Self-pay | Admitting: Family

## 2020-04-21 ENCOUNTER — Other Ambulatory Visit: Payer: Self-pay | Admitting: Family

## 2020-04-23 NOTE — Telephone Encounter (Signed)
Please call and ask her to schedule OV; we cannot continue to refill her medications without OV.

## 2020-04-28 ENCOUNTER — Other Ambulatory Visit: Payer: Self-pay

## 2020-04-28 ENCOUNTER — Ambulatory Visit: Payer: BC Managed Care – PPO | Admitting: Family

## 2020-04-28 ENCOUNTER — Encounter: Payer: Self-pay | Admitting: Family

## 2020-04-28 VITALS — BP 122/82 | HR 71 | Temp 98.7°F | Wt 205.8 lb

## 2020-04-28 DIAGNOSIS — Z1211 Encounter for screening for malignant neoplasm of colon: Secondary | ICD-10-CM

## 2020-04-28 DIAGNOSIS — E119 Type 2 diabetes mellitus without complications: Secondary | ICD-10-CM | POA: Diagnosis not present

## 2020-04-28 DIAGNOSIS — R5383 Other fatigue: Secondary | ICD-10-CM | POA: Diagnosis not present

## 2020-04-28 LAB — CBC WITH DIFFERENTIAL/PLATELET
Basophils Absolute: 0.1 10*3/uL (ref 0.0–0.1)
Basophils Relative: 0.7 % (ref 0.0–3.0)
Eosinophils Absolute: 0.6 10*3/uL (ref 0.0–0.7)
Eosinophils Relative: 5.5 % — ABNORMAL HIGH (ref 0.0–5.0)
HCT: 39.3 % (ref 36.0–46.0)
Hemoglobin: 13.1 g/dL (ref 12.0–15.0)
Lymphocytes Relative: 23.5 % (ref 12.0–46.0)
Lymphs Abs: 2.6 10*3/uL (ref 0.7–4.0)
MCHC: 33.2 g/dL (ref 30.0–36.0)
MCV: 86.8 fl (ref 78.0–100.0)
Monocytes Absolute: 0.4 10*3/uL (ref 0.1–1.0)
Monocytes Relative: 3.9 % (ref 3.0–12.0)
Neutro Abs: 7.4 10*3/uL (ref 1.4–7.7)
Neutrophils Relative %: 66.4 % (ref 43.0–77.0)
Platelets: 266 10*3/uL (ref 150.0–400.0)
RBC: 4.53 Mil/uL (ref 3.87–5.11)
RDW: 14.1 % (ref 11.5–15.5)
WBC: 11.1 10*3/uL — ABNORMAL HIGH (ref 4.0–10.5)

## 2020-04-28 LAB — COMPREHENSIVE METABOLIC PANEL
ALT: 14 U/L (ref 0–35)
AST: 15 U/L (ref 0–37)
Albumin: 4.5 g/dL (ref 3.5–5.2)
Alkaline Phosphatase: 103 U/L (ref 39–117)
BUN: 25 mg/dL — ABNORMAL HIGH (ref 6–23)
CO2: 29 mEq/L (ref 19–32)
Calcium: 9.6 mg/dL (ref 8.4–10.5)
Chloride: 106 mEq/L (ref 96–112)
Creatinine, Ser: 0.86 mg/dL (ref 0.40–1.20)
GFR: 73.57 mL/min (ref >60.00)
Glucose, Bld: 79 mg/dL (ref 70–99)
Potassium: 3.8 mEq/L (ref 3.5–5.1)
Sodium: 141 mEq/L (ref 135–145)
Total Bilirubin: 0.5 mg/dL (ref 0.2–1.2)
Total Protein: 7.3 g/dL (ref 6.0–8.3)

## 2020-04-28 LAB — LIPID PANEL
Cholesterol: 158 mg/dL (ref 0–200)
HDL: 56.7 mg/dL (ref >39.00)
LDL Cholesterol: 72 mg/dL (ref 0–99)
NonHDL: 100.83
Total CHOL/HDL Ratio: 3
Triglycerides: 143 mg/dL (ref 0.0–149.0)
VLDL: 28.6 mg/dL (ref 0.0–40.0)

## 2020-04-28 LAB — VITAMIN B12: Vitamin B-12: 572 pg/mL (ref 211–911)

## 2020-04-28 LAB — POCT GLYCOSYLATED HEMOGLOBIN (HGB A1C)
HbA1c, POC (controlled diabetic range): 5.8 % (ref 0.0–7.0)
Hemoglobin A1C: 5.8 % — AB (ref 4.0–5.6)

## 2020-04-28 LAB — TSH: TSH: 1.25 u[IU]/mL (ref 0.35–4.50)

## 2020-04-28 NOTE — Progress Notes (Signed)
Tracie Burton is a 60 y.o. female with the following history as recorded in EpicCare:  Patient Active Problem List   Diagnosis Date Noted  . Screening for cervical cancer 08/01/2017  . Constipation 08/01/2017  . Headache 07/01/2017  . Refugee health examination 07/01/2017    Current Outpatient Medications  Medication Sig Dispense Refill  . TRULICITY 1.5 WC/5.8NI SOPN ADMINISTER 1.5 MG UNDER THE SKIN 1 TIME A WEEK 2 mL 0   No current facility-administered medications for this visit.    Allergies: Patient has no known allergies.  No past medical history on file.  Past Surgical History:  Procedure Laterality Date  . APPENDECTOMY    . [place]  . CHOLECYSTECTOMY  1989    Family History  Problem Relation Age of Onset  . Breast cancer Mother 76  . Diabetes Father     Social History   Tobacco Use  . Smoking status: Former Research scientist (life sciences)  . Smokeless tobacco: Never Used  Substance Use Topics  . Alcohol use: Never    Subjective:  Accompanied by daughter who serves as Optometrist; has lost 50 pounds in the past year; overall, feeling very good; does occasionally feel dizzy/ light-headed and feels that her eyes are "tired." Has not taken Trulicity in the past 1-2 weeks;   Objective:  Vitals:   04/28/20 1412  BP: 122/82  Pulse: 71  Temp: 98.7 F (37.1 C)  TempSrc: Oral  SpO2: 96%  Weight: 205 lb 12.8 oz (93.4 kg)    General: Well developed, well nourished, in no acute distress  Skin : Warm and dry.  Head: Normocephalic and atraumatic  Eyes: Sclera and conjunctiva clear; pupils round and reactive to light; extraocular movements intact  Ears: External normal; canals clear; tympanic membranes normal  Oropharynx: Pink, supple. No suspicious lesions  Neck: Supple without thyromegaly, adenopathy  Lungs: Respirations unlabored; clear to auscultation bilaterally without wheeze, rales, rhonchi  CVS exam: normal rate and regular rhythm.   Neurologic: Alert and oriented; speech intact; face symmetrical; moves all extremities well; CNII-XII intact without focal deficit   Assessment:  1. Type 2 diabetes mellitus without complication, without long-term current use of insulin (HCC)   2. Other fatigue   3. Colon cancer screening     Plan:  Hgba1c in office today is 5.8; congratulated patient on weight loss and commitment to health; will try stopping Trulicity- feel that she can manage her diabetes with diet and exercise at this point; if need to re-start medication, would only use .75 dosage; Check other labs to evaluate for source of fatigue but question if related to low blood sugar; Defers colonoscopy but agreeable to Cologuard; test is ordered;  Follow-up in 6-8 weeks, sooner prn.  This visit occurred during the SARS-CoV-2 public health emergency.  Safety protocols were in place, including screening questions prior to the visit, additional usage of staff PPE, and extensive cleaning of exam room while observing appropriate contact time as indicated for disinfecting solutions.      Return in about 6 weeks (around 06/09/2020).  Orders Placed This Encounter  Procedures  . CBC with Differential/Platelet    Standing Status:   Future    Number of Occurrences:   1    Standing Expiration Date:   04/28/2021  . Comp Met (CMET)    Standing Status:   Future    Number of Occurrences:   1    Standing Expiration Date:   04/28/2021  . Lipid panel  Standing Status:   Future    Number of Occurrences:   1    Standing Expiration Date:   04/28/2021  . TSH    Standing Status:   Future    Number of Occurrences:   1    Standing Expiration Date:   04/28/2021  . B12    Standing Status:   Future    Number of Occurrences:   1    Standing Expiration Date:   04/28/2021  . POCT glycosylated hemoglobin (Hb A1C)    Requested Prescriptions    No prescriptions requested or ordered in this encounter

## 2020-05-02 ENCOUNTER — Telehealth: Payer: Self-pay

## 2020-05-02 NOTE — Telephone Encounter (Signed)
-----   Message from [name], [place] sent at 05/01/2020  3:42 PM EST ----- Will have to speak to daughter or mail letter; Overall, labs look very good;  WBC was up slightly- down significant; will plan to re-check at her OV in 2 months.  If eyes continue to bother her, she needs to see her eye doctor.

## 2020-05-22 DIAGNOSIS — Z1211 Encounter for screening for malignant neoplasm of colon: Secondary | ICD-10-CM | POA: Diagnosis not present

## 2020-05-22 LAB — COLOGUARD: Cologuard: NEGATIVE

## 2020-05-29 LAB — COLOGUARD: COLOGUARD: NEGATIVE

## 2020-06-01 ENCOUNTER — Encounter: Payer: Self-pay | Admitting: Family

## 2020-06-06 NOTE — Telephone Encounter (Signed)
Daughter notified of result note; confirms that she received mailed results on yesterday. Given negative cologuard results.   Daughter has no additional ques/concerns at this time.

## 2020-06-30 ENCOUNTER — Ambulatory Visit: Payer: BC Managed Care – PPO | Admitting: Family

## 2020-06-30 DIAGNOSIS — Z0289 Encounter for other administrative examinations: Secondary | ICD-10-CM

## 2020-10-17 ENCOUNTER — Ambulatory Visit: Payer: BC Managed Care – PPO | Admitting: Family

## 2020-10-17 ENCOUNTER — Encounter: Payer: Self-pay | Admitting: Family

## 2020-10-17 ENCOUNTER — Other Ambulatory Visit: Payer: Self-pay

## 2020-10-17 VITALS — BP 160/100 | HR 60 | Temp 98.4°F | Ht 66.93 in | Wt 230.8 lb

## 2020-10-17 DIAGNOSIS — B372 Candidiasis of skin and nail: Secondary | ICD-10-CM | POA: Diagnosis not present

## 2020-10-17 DIAGNOSIS — N760 Acute vaginitis: Secondary | ICD-10-CM | POA: Diagnosis not present

## 2020-10-17 DIAGNOSIS — E1169 Type 2 diabetes mellitus with other specified complication: Secondary | ICD-10-CM

## 2020-10-17 DIAGNOSIS — G44219 Episodic tension-type headache, not intractable: Secondary | ICD-10-CM | POA: Diagnosis not present

## 2020-10-17 LAB — CBC WITH DIFFERENTIAL/PLATELET
Basophils Absolute: 0.1 10*3/uL (ref 0.0–0.1)
Basophils Relative: 0.9 % (ref 0.0–3.0)
Eosinophils Absolute: 0.3 10*3/uL (ref 0.0–0.7)
Eosinophils Relative: 4.4 % (ref 0.0–5.0)
HCT: 42.2 % (ref 36.0–46.0)
Hemoglobin: 14 g/dL (ref 12.0–15.0)
Lymphocytes Relative: 23.9 % (ref 12.0–46.0)
Lymphs Abs: 1.9 10*3/uL (ref 0.7–4.0)
MCHC: 33.2 g/dL (ref 30.0–36.0)
MCV: 86.9 fl (ref 78.0–100.0)
Monocytes Absolute: 0.3 10*3/uL (ref 0.1–1.0)
Monocytes Relative: 3.9 % (ref 3.0–12.0)
Neutro Abs: 5.2 10*3/uL (ref 1.4–7.7)
Neutrophils Relative %: 66.9 % (ref 43.0–77.0)
Platelets: 282 10*3/uL (ref 150.0–400.0)
RBC: 4.86 Mil/uL (ref 3.87–5.11)
RDW: 13.8 % (ref 11.5–15.5)
WBC: 7.8 10*3/uL (ref 4.0–10.5)

## 2020-10-17 LAB — COMPREHENSIVE METABOLIC PANEL
ALT: 16 U/L (ref 0–35)
AST: 14 U/L (ref 0–37)
Albumin: 4.6 g/dL (ref 3.5–5.2)
Alkaline Phosphatase: 121 U/L — ABNORMAL HIGH (ref 39–117)
BUN: 18 mg/dL (ref 6–23)
CO2: 30 mEq/L (ref 19–32)
Calcium: 9.4 mg/dL (ref 8.4–10.5)
Chloride: 102 mEq/L (ref 96–112)
Creatinine, Ser: 0.74 mg/dL (ref 0.40–1.20)
GFR: 87.82 mL/min (ref >60.00)
Glucose, Bld: 123 mg/dL — ABNORMAL HIGH (ref 70–99)
Potassium: 4.5 mEq/L (ref 3.5–5.1)
Sodium: 138 mEq/L (ref 135–145)
Total Bilirubin: 0.8 mg/dL (ref 0.2–1.2)
Total Protein: 7.2 g/dL (ref 6.0–8.3)

## 2020-10-17 LAB — HEMOGLOBIN A1C: Hgb A1c MFr Bld: 7.6 % — ABNORMAL HIGH (ref 4.6–6.5)

## 2020-10-17 MED ORDER — TRULICITY 0.75 MG/0.5ML ~~LOC~~ SOAJ: 0.7500 mg | SUBCUTANEOUS | 1 refills | Status: DC

## 2020-10-17 MED ORDER — FLUCONAZOLE 150 MG PO TABS: ORAL_TABLET | ORAL | 0 refills | Status: DC

## 2020-10-17 MED ORDER — VALSARTAN 160 MG PO TABS: 160.0000 mg | ORAL_TABLET | Freq: Every day | ORAL | 0 refills | Status: DC

## 2020-10-17 MED ORDER — NYSTATIN-TRIAMCINOLONE 100000-0.1 UNIT/GM-% EX CREA: 1.0000 "application " | TOPICAL_CREAM | Freq: Two times a day (BID) | CUTANEOUS | 0 refills | Status: AC

## 2020-10-17 NOTE — Progress Notes (Signed)
I have attempted to call pt via interpretor to relay lab results. There was no answer so I left a message to call back.

## 2020-10-17 NOTE — Progress Notes (Signed)
Tracie Burton is a 60 y.o. female with the following history as recorded in EpicCare:  Patient Active Problem List   Diagnosis Date Noted   Screening for cervical cancer 08/01/2017   Constipation 08/01/2017   Headache 07/01/2017   Refugee health examination 07/01/2017    Current Outpatient Medications  Medication Sig Dispense Refill   Dulaglutide (TRULICITY) 8.10 FB/5.1WC SOPN Inject 0.75 mg into the skin once a week. 2 mL 1   fluconazole (DIFLUCAN) 150 MG tablet Take 1 tablet daily as directed; repeat after 72 hours 2 tablet 0   nystatin-triamcinolone (MYCOLOG II) cream Apply 1 application topically 2 (two) times daily. 60 g 0   valsartan (DIOVAN) 160 MG tablet Take 1 tablet (160 mg total) by mouth daily. 90 tablet 0   No current facility-administered medications for this visit.    Allergies: Patient has no known allergies.  No past medical history on file.  Past Surgical History:  Procedure Laterality Date   [place]    Family History  Problem Relation Age of Onset   Breast cancer Mother 23   Diabetes Father     Social History   Tobacco Use   Smoking status: Former    Pack years: 0.00   Smokeless tobacco: Never  Substance Use Topics   Alcohol use: Never    Subjective:  Daughter is present and serves as Optometrist;  Follow up on Type 2 Diabetes; last Hgba1c at 5.8 in January and diabetes medication was stopped; has gained 25 pounds since last OV; having increased problems with yeast ( vaginitis) and rash beneath breasts;    Does also note that has been having increased headaches recently; notes she often wakes up with a headache; no blurred vision or chest pain;   Objective:  Vitals:   10/17/20 0830 10/17/20 0851  BP: (!) 170/90 (!) 160/100  Pulse: 60   Temp: 98.4 F (36.9 C)   TempSrc: Oral   SpO2: 97%   Weight: 230 lb 12.8 oz (104.7 kg)   Height: 5' 6.93" (1.7 m)      General: Well developed, well nourished, in no acute distress  Skin : Warm and dry. Discoloration noted beneath both breasts Head: Normocephalic and atraumatic  Eyes: Sclera and conjunctiva clear; pupils round and reactive to light; extraocular movements intact  Ears: External normal; canals clear; tympanic membranes normal  Oropharynx: Pink, supple. No suspicious lesions  Neck: Supple without thyromegaly, adenopathy  Lungs: Respirations unlabored; clear to auscultation bilaterally without wheeze, rales, rhonchi  CVS exam: normal rate and regular rhythm.  Abdomen: Soft; nontender; nondistended; normoactive bowel sounds; no masses or hepatosplenomegaly  Musculoskeletal: No deformities; no active joint inflammation  Extremities: No edema, cyanosis, clubbing  Vessels: Symmetric bilaterally  Neurologic: Alert and oriented; speech intact; face symmetrical; moves all extremities well; CNII-XII intact without focal deficit  Assessment:  1. Type 2 diabetes mellitus with other specified complication, unspecified whether long term insulin use ([place])   2. Acute vaginitis   3. Yeast dermatitis   4. Episodic tension-type headache, not intractable     Plan:  Update labs today; re-start Trulicity and plan for follow up in 4-6 weeks; she notes this office is not convenient for her and would like to see a female MD at the [facility] location; will help get this coordinated; 2. & 3. Suspect secondary to elevated blood sugar; Rx for Diflucan and Mycolog II;  4. Suspect new onset hypertension; trial of Diovan 160 mg; follow up in 4-6 weeks;   This visit occurred during the SARS-CoV-2 public health emergency.  Safety protocols were in place, including screening questions prior to the visit, additional usage of staff PPE, and extensive cleaning of exam room while observing appropriate contact time as indicated for disinfecting solutions.    No follow-ups on file.  Orders Placed This Encounter  Procedures    CBC with Differential/Platelet   Comp Met (CMET)   Hemoglobin A1c    Requested Prescriptions   Signed Prescriptions Disp Refills   fluconazole (DIFLUCAN) 150 MG tablet 2 tablet 0    Sig: Take 1 tablet daily as directed; repeat after 72 hours   nystatin-triamcinolone (MYCOLOG II) cream 60 g 0    Sig: Apply 1 application topically 2 (two) times daily.   Dulaglutide (TRULICITY) 4.60 CG/9.8OR SOPN 2 mL 1    Sig: Inject 0.75 mg into the skin once a week.   valsartan (DIOVAN) 160 MG tablet 90 tablet 0    Sig: Take 1 tablet (160 mg total) by mouth daily.

## 2020-10-19 NOTE — Progress Notes (Signed)
Patient advised of result, medication and provider's advised to follow up with Dr. Quay Burow. She verbalized understanding. (This conversation was in [place])

## 2020-10-19 NOTE — Progress Notes (Signed)
Left detailed message in spanish for patient to call back in regards to her lab results and new instructions from pcp.

## 2020-12-06 DIAGNOSIS — N760 Acute vaginitis: Secondary | ICD-10-CM | POA: Diagnosis not present

## 2020-12-06 DIAGNOSIS — Z6836 Body mass index (BMI) 36.0-36.9, adult: Secondary | ICD-10-CM | POA: Diagnosis not present

## 2020-12-06 DIAGNOSIS — N840 Polyp of corpus uteri: Secondary | ICD-10-CM | POA: Diagnosis not present

## 2020-12-06 DIAGNOSIS — R102 Pelvic and perineal pain: Secondary | ICD-10-CM | POA: Diagnosis not present

## 2020-12-06 DIAGNOSIS — N898 Other specified noninflammatory disorders of vagina: Secondary | ICD-10-CM | POA: Diagnosis not present

## 2020-12-12 ENCOUNTER — Telehealth: Payer: Self-pay | Admitting: Family

## 2020-12-12 ENCOUNTER — Other Ambulatory Visit: Payer: Self-pay | Admitting: Family

## 2020-12-12 NOTE — Telephone Encounter (Signed)
Called but no answer, lvm for patient to call  back about her diabetic care and follow up (Message was left in [place])

## 2020-12-12 NOTE — Telephone Encounter (Signed)
Please let me know if I am overstepping to ask for your help. This lovely lady only speaks Spanish and I want to make sure we have a plan for her continued care. If I need to have [facility] use an interpreter, please just let me know.  Is she planning to follow up here or establish with Dr. Quay Burow? She will need diabetes follow up in September.  I will refill Trulicity for her for one month for now.

## 2020-12-15 NOTE — Telephone Encounter (Signed)
Lvm for patient to call back

## 2020-12-18 NOTE — Telephone Encounter (Signed)
Lvm again for patient to call back, she needs to call [facility] at [phone] for appointment with Dr. Mitchel Honour for np appointment.

## 2020-12-18 NOTE — Telephone Encounter (Signed)
Patient advised of provider's advise to call [facility] for appointment with Dr. Melodie Bouillon, phone number provided

## 2021-01-12 ENCOUNTER — Other Ambulatory Visit: Payer: Self-pay | Admitting: Family

## 2021-01-15 ENCOUNTER — Other Ambulatory Visit: Payer: Self-pay | Admitting: Family

## 2021-02-09 ENCOUNTER — Encounter (HOSPITAL_BASED_OUTPATIENT_CLINIC_OR_DEPARTMENT_OTHER): Payer: Self-pay | Admitting: Obstetrics and Gynecology

## 2021-02-14 ENCOUNTER — Other Ambulatory Visit: Payer: Self-pay | Admitting: Family

## 2021-02-19 ENCOUNTER — Ambulatory Visit (HOSPITAL_BASED_OUTPATIENT_CLINIC_OR_DEPARTMENT_OTHER)
Admission: RE | Admit: 2021-02-19 | Payer: BC Managed Care – PPO | Source: Home / Self Care | Admitting: Obstetrics and Gynecology

## 2021-02-19 HISTORY — DX: Long term (current) use of insulin: E11.9

## 2021-02-19 HISTORY — DX: Polyp of corpus uteri: N84.0

## 2021-02-19 HISTORY — DX: Type 2 diabetes mellitus without complications: Z79.4

## 2021-02-19 HISTORY — DX: Essential (primary) hypertension: I10

## 2021-02-19 SURGERY — DILATATION & CURETTAGE/HYSTEROSCOPY WITH MYOSURE
Anesthesia: Monitor Anesthesia Care

## 2021-03-13 ENCOUNTER — Other Ambulatory Visit: Payer: Self-pay | Admitting: Family

## 2021-03-13 ENCOUNTER — Telehealth: Payer: Self-pay | Admitting: Family

## 2021-03-13 NOTE — Telephone Encounter (Signed)
Letter sent to pt home.

## 2021-03-13 NOTE — Telephone Encounter (Signed)
Please mail her a letter- we had talked about having her establish with Dr. Mitchel Honour at [facility] office for continuing care. He is fluent in [place] and very good provider.  She needs to do a diabetes follow up in the next 1-2 months please.

## 2021-04-14 ENCOUNTER — Other Ambulatory Visit: Payer: Self-pay | Admitting: Family

## 2021-04-17 ENCOUNTER — Other Ambulatory Visit: Payer: Self-pay | Admitting: Family

## 2021-05-12 ENCOUNTER — Other Ambulatory Visit: Payer: Self-pay | Admitting: Family

## 2021-05-23 ENCOUNTER — Telehealth: Payer: Self-pay | Admitting: Family

## 2021-05-23 DIAGNOSIS — E1169 Type 2 diabetes mellitus with other specified complication: Secondary | ICD-10-CM

## 2021-05-23 NOTE — Telephone Encounter (Signed)
OK with me.

## 2021-05-23 NOTE — Telephone Encounter (Signed)
Daughter Berdine Addison calling in on behalf of patient  Wants to transfer care from Dr. Valere Dross to Dr. Berline Chough I would check w/ both providers & cb to schedule if approved

## 2021-05-23 NOTE — Telephone Encounter (Signed)
Medication:  TRULICITY 2.86 NO/1.7RN SOPN [[id number]]     Has the patient contacted their pharmacy? No. (If no, request that the patient contact the pharmacy for the refill.) (If yes, when and what did the pharmacy advise?)     Preferred Pharmacy (with phone number or street name):  [facility] [place], [place] - [address] AT [address]  [place], [place] [address]  Phone:  [phone]  Fax:  [phone]     Agent: Please be advised that RX refills may take up to 3 business days. We ask that you follow-up with your pharmacy.

## 2021-05-24 MED ORDER — TRULICITY 0.75 MG/0.5ML ~~LOC~~ SOAJ: SUBCUTANEOUS | 0 refills | Status: DC

## 2021-05-24 NOTE — Telephone Encounter (Signed)
Rx has been filled for 1 month. She has follow up appt next week

## 2021-05-28 ENCOUNTER — Telehealth: Payer: Self-pay

## 2021-05-28 NOTE — Telephone Encounter (Signed)
PA initiated via Covermymeds; KEY: [id number]. Awaiting determination.

## 2021-05-29 ENCOUNTER — Ambulatory Visit: Payer: 59 | Admitting: Family

## 2021-05-29 VITALS — BP 124/80 | HR 68 | Temp 98.3°F | Resp 18 | Ht 66.0 in | Wt 228.8 lb

## 2021-05-29 DIAGNOSIS — N649 Disorder of breast, unspecified: Secondary | ICD-10-CM | POA: Diagnosis not present

## 2021-05-29 DIAGNOSIS — N644 Mastodynia: Secondary | ICD-10-CM

## 2021-05-29 DIAGNOSIS — M79671 Pain in right foot: Secondary | ICD-10-CM

## 2021-05-29 DIAGNOSIS — R0683 Snoring: Secondary | ICD-10-CM

## 2021-05-29 DIAGNOSIS — R252 Cramp and spasm: Secondary | ICD-10-CM | POA: Diagnosis not present

## 2021-05-29 DIAGNOSIS — E1169 Type 2 diabetes mellitus with other specified complication: Secondary | ICD-10-CM | POA: Diagnosis not present

## 2021-05-29 DIAGNOSIS — M79672 Pain in left foot: Secondary | ICD-10-CM

## 2021-05-29 MED ORDER — TRULICITY 0.75 MG/0.5ML ~~LOC~~ SOAJ: SUBCUTANEOUS | 0 refills | Status: DC

## 2021-05-29 MED ORDER — MUPIROCIN 2 % EX OINT: 1.0000 "application " | TOPICAL_OINTMENT | Freq: Two times a day (BID) | CUTANEOUS | 0 refills | Status: AC

## 2021-05-29 NOTE — Patient Instructions (Signed)
Apnea del sueo Sleep Apnea La apnea del sueo es una afeccin en la que la respiracin se detiene o se hace superficial mientras duerme. Las personas con apnea del sueo suelen Photographer. Pueden tener [employer] se quedan sin aliento y dejan de respirar durante 10 segundos o ms durante el sueo. Esto puede suceder muchas veces durante la noche. La apnea del sueo interrumpe el sueo y evita que el cuerpo descanse como lo necesita. Esta afeccin puede aumentar el riesgo de sufrir ciertos problemas de [place], como: Infarto de miocardio. Accidente cerebrovascular. Obesidad. Diabetes tipo 2. Insuficiencia cardaca. Latidos cardacos irregulares. Presin arterial alta. El [place] del tratamiento es ayudarle a respirar normalmente otra vez. Cules son las causas? La causa ms frecuente de la apnea del sueo es la obstruccin o el colapso de las vas respiratorias. Existen tres tipos de apnea del sueo: Apnea obstructiva del sueo. Este tipo de apnea ocurre cuando las vas respiratorias se obstruyen o colapsan. Apnea central del sueo. Este tipo ocurre cuando la parte del cerebro que controla la respiracin no enva las seales correctas a los msculos que controlan la respiracin. Apnea mixta del sueo. Esta es una combinacin de apnea obstructiva y central del sueo. Qu incrementa el riesgo? Es ms probable que tenga esta afeccin si: Tiene sobrepeso. Fuma. Tiene vas respiratorias ms pequeas que lo normal. Es de edad avanzada. Es hombre. Bebe alcohol. Toma sedantes o tranquilizantes. Tiene antecedentes familiares de apnea del sueo. Tiene la lengua o las amgdalas ms grandes de lo normal. Cules son los signos o sntomas? Los sntomas de esta afeccin incluyen: Dificultad para Public affairs consultant dormido. Ronquidos fuertes. Dolores de cabeza matutinos. Despertarse con falta de aliento. Sequedad de boca o dolor de garganta por la maana. Somnolencia y Teacher, early years/pre. Si tiene fatiga diurna debido a la apnea del sueo, podra ser ms propenso a tener: Dificultad para concentrarse. Olvidos. Irritabilidad o cambios de humor. Cambios en la personalidad. Sentimientos de depresin. Disfuncin sexual. Esto puede incluir la prdida de inters si es mujer o la disfuncin erctil si es hombre. Cmo se diagnostica? Esta afeccin se puede diagnosticar con lo siguiente: Los antecedentes mdicos. Un examen fsico. Ardelia Mems serie de pruebas que se realizan [employer] persona duerme (estudio del sueo). Estas pruebas generalmente se hacen en un laboratorio del sueo, pero tambin pueden Development worker, international aid. Cmo se trata? El tratamiento de esta afeccin tiene como objetivo restablecer la respiracin normal y [employer] sntomas durante el sueo. Puede implicar controlar los problemas de salud que pueden afectar la respiracin, como la presin arterial alta o la obesidad. El tratamiento puede incluir: Dormir de [place]. Si tiene congestin nasal, Tax adviser. Evitar el consumo de depresores, como el alcohol, sedantes y narcticos. Si tiene sobrepeso, [employer]ostics. Realizar cambios en la dieta. Dejar de fumar. Usar un dispositivo para abrir las vas respiratorias mientras duerme; por ejemplo: Un aparato bucal. Se trata de una boquilla hecha a medida que desplaza la mandbula hacia adelante. Un dispositivo de presin positiva de las vas areas continua (continuous positive airway pressure, CPAP). Este dispositivo sopla aire a travs de una mscara cuando usted exhala. Un dispositivo de presin positiva de las vas areas espiratoria nasal (expiratory positive airway pressure, EPAP). Este dispositivo tiene vlvulas que se colocan en cada fosa nasal. Un dispositivo de bipresin positiva en las vas areas (bi-level positive airway pressure, BIPAP). Este dispositivo sopla aire a travs de una mscara cuando usted inhala y exhala. Someterse  a ciruga si  los dems tratamientos no resultan eficaces. Durante la ciruga, el exceso de tejido se elimina para Market researcher las vas respiratorias. Siga estas indicaciones en su casa: Estilo de vida Haga cambios en su estilo de vida segn las recomendaciones de su mdico. Consuma una dieta sana y [place]. Si tiene sobrepeso, tome medidas para bajar de [place]. Evite el uso de depresores, como el alcohol, sedantes y narcticos. No consuma ningn producto que contenga nicotina o tabaco. Estos productos incluyen cigarrillos, tabaco para Higher education careers adviser y aparatos de vapeo, como los Psychologist, sport and exercise. Si necesita ayuda para dejar de consumir estos productos, consulte al [employer]. Indicaciones generales Use los medicamentos de venta libre y los recetados solamente como se lo haya indicado el mdico. Si le proporcionaron un dispositivo para abrir las vas respiratorias mientras duerme, selo solamente como se lo haya indicado el mdico. Si va a someterse a una ciruga, no olvide informarle al mdico que tiene apnea del sueo. Puede ser necesario que lleve su dispositivo consigo. Concurra a [place]. Esto es importante. Comunquese con un mdico si: El dispositivo que le proporcionaron para abrir las vas respiratorias mientras duerme es incmodo o no parece funcionar. Sus sntomas no mejoran. Sus sntomas empeoran. Solicite ayuda de inmediato si: Tiene los siguientes signos y sntomas: Tourist information centre manager. Falta de aire. Malestar en la espalda, los brazos o el [place]. Tiene lo siguiente: Dificultad para hablar. Debilidad en un lado del cuerpo. Se le cae el rostro. Estos sntomas pueden representar un problema grave que constituye Engineer, maintenance (IT). No espere a ver si los sntomas desaparecen. Solicite atencin mdica de inmediato. Comunquese con el servicio de emergencias de su localidad (911 en los Estados [place]idos). No conduzca por sus propios medios [employer] hospital. Resumen La  apnea del sueo es una afeccin en la que la respiracin se detiene o se hace superficial mientras duerme. La causa ms frecuente es la obstruccin o el colapso de las vas respiratorias. El objetivo del tratamiento es restablecer la respiracin normal y Public house manager los sntomas durante el sueo. Esta informacin no tiene Marine scientist el consejo del mdico. Asegrese de hacerle al mdico cualquier pregunta que tenga. Document Revised: 12/20/2020 Document Reviewed: 04/03/2020 Elsevier Patient Education  2022 [employer].

## 2021-05-29 NOTE — Progress Notes (Addendum)
Tracie Burton is a 61 y.o. female with the following history as recorded in EpicCare:  Patient Active Problem List   Diagnosis Date Noted   Screening for cervical cancer 08/01/2017   Constipation 08/01/2017   Headache 07/01/2017   Refugee health examination 07/01/2017    Current Outpatient Medications  Medication Sig Dispense Refill   mupirocin ointment (BACTROBAN) 2 % Apply 1 application topically 2 (two) times daily. 22 g 0   nystatin-triamcinolone (MYCOLOG II) cream Apply 1 application topically 2 (two) times daily. 60 g 0   valsartan (DIOVAN) 160 MG tablet TAKE 1 TABLET(160 MG) BY MOUTH DAILY 90 tablet 0   Dulaglutide (TRULICITY) 2.59 DG/3.8VF SOPN ADMINISTER 0.75 MG UNDER THE SKIN 1 TIME A WEEK 2 mL 0   No current facility-administered medications for this visit.    Allergies: Patient has no known allergies.  Past Medical History:  Diagnosis Date   Endometrial polyp    Hypertension    Type 2 diabetes mellitus treated with insulin ([place])    followed by pcp    Past Surgical History:  Procedure Laterality Date   [place]    Family History  Problem Relation Age of Onset   Breast cancer Mother 11   Diabetes Father     Social History   Tobacco Use   Smoking status: Former   Smokeless tobacco: Never  Substance Use Topics   Alcohol use: Never    Subjective:  Accompanied by daughter who serves as Optometrist; Numerous concerns today that she would like to discuss including Type 2 Diabetes; breast pain; bilateral foot pain; poor sleep habits/ loud snoring; her daughter notes that she can hear her mother snoring from another room in the house.     Objective:  Vitals:   05/29/21 1555  BP: 124/80  Pulse: 68  Resp: 18  Temp: 98.3 F (36.8 C)  TempSrc: Oral  SpO2: 96%  Weight: 228 lb 12.8 oz (103.8 kg)  Height: '5\' 6"'  (1.676 m)    General: Well developed, well nourished, in no  acute distress  Skin : Warm and dry.  Head: Normocephalic and atraumatic  Eyes: Sclera and conjunctiva clear; pupils round and reactive to light; extraocular movements intact  Ears: External normal; canals clear; tympanic membranes normal  Oropharynx: Pink, supple. No suspicious lesions  Neck: Supple without thyromegaly, adenopathy  Lungs: Respirations unlabored; clear to auscultation bilaterally without wheeze, rales, rhonchi  CVS exam: normal rate and regular rhythm.  Abdomen: Soft; nontender; nondistended; normoactive bowel sounds; no masses or hepatosplenomegaly  Musculoskeletal: No deformities; no active joint inflammation  Extremities: No edema, cyanosis, clubbing  Vessels: Symmetric bilaterally  Neurologic: Alert and oriented; speech intact; face symmetrical; moves all extremities well; CNII-XII intact without focal deficit  Assessment:  1. Type 2 diabetes mellitus with other specified complication, unspecified whether long term insulin use ([place])   2. Breast pain, left   3. Breast lesion   4. Leg cramps   5. Bilateral foot pain   6. Loud snoring     Plan:  Update labs today; continue Trulicity; 2 & 3. Order for diagnostic mammogram; Rx for Bactroban ointment to apply to area of concern noted on left breast; 4.  Check labs today; 5.  Refer to podiatry- suspect she will benefit from orthotics; 6.  ? Sleep apnea; refer for sleep study;  Patient is planning to transfer to Dr. Mitchel Honour at [facility]  due to location/ Spanish speaking provider; she understands she is always welcome to come back to this office if needed.   This visit occurred during the SARS-CoV-2 public health emergency.  Safety protocols were in place, including screening questions prior to the visit, additional usage of staff PPE, and extensive cleaning of exam room while observing appropriate contact time as indicated for disinfecting solutions.     No follow-ups on file.  Orders Placed This Encounter   Procedures   MM Digital Diagnostic Bilat    Standing Status:   Future    Standing Expiration Date:   05/29/2022    Order Specific Question:   Reason for Exam (SYMPTOM  OR DIAGNOSIS REQUIRED)    Answer:   left breast pain x 1-2 months    Order Specific Question:   Preferred imaging location?    [facility]   CBC with Differential/Platelet   Comp Met (CMET)   Magnesium   B12   Hemoglobin A1c   Ambulatory referral to Podiatry    Referral Priority:   Routine    Referral Type:   Consultation    Referral Reason:   Specialty Services Required    Requested Specialty:   Podiatry    Number of Visits Requested:   1   Ambulatory referral to Neurology    Referral Priority:   Routine    Referral Type:   Consultation    Referral Reason:   Specialty Services Required    Requested Specialty:   Neurology    Number of Visits Requested:   1    Requested Prescriptions   Signed Prescriptions Disp Refills   Dulaglutide (TRULICITY) 8.67 RJ/7.3GK SOPN 2 mL 0    Sig: ADMINISTER 0.75 MG UNDER THE SKIN 1 TIME A WEEK   mupirocin ointment (BACTROBAN) 2 % 22 g 0    Sig: Apply 1 application topically 2 (two) times daily.

## 2021-05-30 LAB — CBC WITH DIFFERENTIAL/PLATELET
Basophils Absolute: 0.1 10*3/uL (ref 0.0–0.1)
Basophils Relative: 1.2 % (ref 0.0–3.0)
Eosinophils Absolute: 0.4 10*3/uL (ref 0.0–0.7)
Eosinophils Relative: 4.7 % (ref 0.0–5.0)
HCT: 41 % (ref 36.0–46.0)
Hemoglobin: 13.3 g/dL (ref 12.0–15.0)
Lymphocytes Relative: 24.3 % (ref 12.0–46.0)
Lymphs Abs: 2.2 10*3/uL (ref 0.7–4.0)
MCHC: 32.5 g/dL (ref 30.0–36.0)
MCV: 87.3 fl (ref 78.0–100.0)
Monocytes Absolute: 0.4 10*3/uL (ref 0.1–1.0)
Monocytes Relative: 4 % (ref 3.0–12.0)
Neutro Abs: 6 10*3/uL (ref 1.4–7.7)
Neutrophils Relative %: 65.8 % (ref 43.0–77.0)
Platelets: 265 10*3/uL (ref 150.0–400.0)
RBC: 4.69 Mil/uL (ref 3.87–5.11)
RDW: 14.3 % (ref 11.5–15.5)
WBC: 9.1 10*3/uL (ref 4.0–10.5)

## 2021-05-30 LAB — MAGNESIUM: Magnesium: 2 mg/dL (ref 1.5–2.5)

## 2021-05-30 LAB — COMPREHENSIVE METABOLIC PANEL
ALT: 12 U/L (ref 0–35)
AST: 14 U/L (ref 0–37)
Albumin: 4.3 g/dL (ref 3.5–5.2)
Alkaline Phosphatase: 116 U/L (ref 39–117)
BUN: 24 mg/dL — ABNORMAL HIGH (ref 6–23)
CO2: 23 mEq/L (ref 19–32)
Calcium: 9 mg/dL (ref 8.4–10.5)
Chloride: 107 mEq/L (ref 96–112)
Creatinine, Ser: 0.88 mg/dL (ref 0.40–1.20)
GFR: 71.02 mL/min (ref >60.00)
Glucose, Bld: 133 mg/dL — ABNORMAL HIGH (ref 70–99)
Potassium: 4.1 mEq/L (ref 3.5–5.1)
Sodium: 142 mEq/L (ref 135–145)
Total Bilirubin: 0.5 mg/dL (ref 0.2–1.2)
Total Protein: 7.1 g/dL (ref 6.0–8.3)

## 2021-05-30 LAB — HEMOGLOBIN A1C: Hgb A1c MFr Bld: 6.7 % — ABNORMAL HIGH (ref 4.6–6.5)

## 2021-05-30 LAB — VITAMIN B12: Vitamin B-12: 273 pg/mL (ref 211–911)

## 2021-05-30 NOTE — Telephone Encounter (Signed)
PA denied. Pt's plan needing proof that Pt has type 2 diabetes and has tried/failed metformin. Will appeal.

## 2021-05-30 NOTE — Telephone Encounter (Signed)
PA denied. Awaiting denial information.  °

## 2021-05-30 NOTE — Telephone Encounter (Signed)
Appeal letter, labs and OV note faxed to [employer] at [phone]. Awaiting determination.

## 2021-05-31 NOTE — Telephone Encounter (Signed)
Appeal approved. Effective 05/30/21 to 05/30/22.

## 2021-06-06 ENCOUNTER — Other Ambulatory Visit: Payer: Self-pay | Admitting: Family

## 2021-06-07 ENCOUNTER — Other Ambulatory Visit (HOSPITAL_BASED_OUTPATIENT_CLINIC_OR_DEPARTMENT_OTHER): Payer: Self-pay | Admitting: Family

## 2021-06-07 DIAGNOSIS — Z1231 Encounter for screening mammogram for malignant neoplasm of breast: Secondary | ICD-10-CM

## 2021-06-11 ENCOUNTER — Ambulatory Visit (HOSPITAL_BASED_OUTPATIENT_CLINIC_OR_DEPARTMENT_OTHER)
Admission: RE | Admit: 2021-06-11 | Discharge: 2021-06-11 | Disposition: A | Discharge status: Home / Self Care | Payer: 59 | Source: Ambulatory Visit | Attending: Family | Admitting: Family

## 2021-06-11 ENCOUNTER — Other Ambulatory Visit: Payer: Self-pay

## 2021-06-11 ENCOUNTER — Ambulatory Visit (HOSPITAL_BASED_OUTPATIENT_CLINIC_OR_DEPARTMENT_OTHER): Payer: 59

## 2021-06-11 ENCOUNTER — Encounter (HOSPITAL_BASED_OUTPATIENT_CLINIC_OR_DEPARTMENT_OTHER): Payer: Self-pay

## 2021-06-11 DIAGNOSIS — Z1231 Encounter for screening mammogram for malignant neoplasm of breast: Secondary | ICD-10-CM | POA: Insufficient documentation

## 2021-06-28 ENCOUNTER — Other Ambulatory Visit: Payer: Self-pay | Admitting: Family

## 2021-06-28 DIAGNOSIS — E1169 Type 2 diabetes mellitus with other specified complication: Secondary | ICD-10-CM

## 2021-07-13 ENCOUNTER — Other Ambulatory Visit: Payer: Self-pay | Admitting: Family

## 2021-09-24 ENCOUNTER — Encounter: Payer: Self-pay | Admitting: Emergency Medicine

## 2021-09-24 ENCOUNTER — Ambulatory Visit (INDEPENDENT_AMBULATORY_CARE_PROVIDER_SITE_OTHER): Payer: 59 | Admitting: Emergency Medicine

## 2021-09-24 VITALS — BP 134/82 | HR 70 | Temp 98.0°F | Ht 66.0 in | Wt 226.5 lb

## 2021-09-24 DIAGNOSIS — R413 Other amnesia: Secondary | ICD-10-CM

## 2021-09-24 DIAGNOSIS — E1159 Type 2 diabetes mellitus with other circulatory complications: Secondary | ICD-10-CM

## 2021-09-24 DIAGNOSIS — Z7689 Persons encountering health services in other specified circumstances: Secondary | ICD-10-CM

## 2021-09-24 DIAGNOSIS — I152 Hypertension secondary to endocrine disorders: Secondary | ICD-10-CM

## 2021-09-24 DIAGNOSIS — G3184 Mild cognitive impairment, so stated: Secondary | ICD-10-CM | POA: Insufficient documentation

## 2021-09-24 LAB — COMPREHENSIVE METABOLIC PANEL
ALT: 12 U/L (ref 0–35)
AST: 14 U/L (ref 0–37)
Albumin: 4.3 g/dL (ref 3.5–5.2)
Alkaline Phosphatase: 129 U/L — ABNORMAL HIGH (ref 39–117)
BUN: 14 mg/dL (ref 6–23)
CO2: 31 mEq/L (ref 19–32)
Calcium: 9.1 mg/dL (ref 8.4–10.5)
Chloride: 103 mEq/L (ref 96–112)
Creatinine, Ser: 0.86 mg/dL (ref 0.40–1.20)
GFR: 72.85 mL/min (ref >60.00)
Glucose, Bld: 102 mg/dL — ABNORMAL HIGH (ref 70–99)
Potassium: 4.2 mEq/L (ref 3.5–5.1)
Sodium: 140 mEq/L (ref 135–145)
Total Bilirubin: 0.5 mg/dL (ref 0.2–1.2)
Total Protein: 7.2 g/dL (ref 6.0–8.3)

## 2021-09-24 LAB — POCT GLYCOSYLATED HEMOGLOBIN (HGB A1C): Hemoglobin A1C: 6.3 % — AB (ref 4.0–5.6)

## 2021-09-24 LAB — LIPID PANEL
Cholesterol: 175 mg/dL (ref 0–200)
HDL: 58.1 mg/dL (ref >39.00)
LDL Cholesterol: 82 mg/dL (ref 0–99)
NonHDL: 116.8
Total CHOL/HDL Ratio: 3
Triglycerides: 174 mg/dL — ABNORMAL HIGH (ref 0.0–149.0)
VLDL: 34.8 mg/dL (ref 0.0–40.0)

## 2021-09-24 LAB — MICROALBUMIN / CREATININE URINE RATIO
Creatinine,U: 45.6 mg/dL
Microalb Creat Ratio: 1.5 mg/g (ref 0.0–30.0)
Microalb, Ur: <0.7 mg/dL (ref 0.0–1.9)

## 2021-09-24 MED ORDER — TRULICITY 1.5 MG/0.5ML ~~LOC~~ SOAJ: 1.5000 mg | SUBCUTANEOUS | 7 refills | Status: DC

## 2021-09-24 MED ORDER — VALSARTAN 160 MG PO TABS: ORAL_TABLET | ORAL | 3 refills | Status: DC

## 2021-09-24 MED ORDER — ROSUVASTATIN CALCIUM 10 MG PO TABS: 10.0000 mg | ORAL_TABLET | Freq: Every day | ORAL | 3 refills | Status: DC

## 2021-09-24 NOTE — Assessment & Plan Note (Signed)
Well-controlled hypertension.  Continue valsartan 160 mg daily. BP Readings from Last 3 Encounters:  09/24/21 134/82  05/29/21 124/80  10/17/20 (!) 160/100  Dietary approaches to stop hypertension discussed Well-controlled diabetes with hemoglobin A1c of 6.3 Lab Results  Component Value Date   HGBA1C 6.3 (A) 09/24/2021  Diet and nutrition discussed. Tolerating Trulicity very well.  Will increase dose to 1.5 mg weekly. Cardiovascular risks associated with hypertension and diabetes discussed. We will start rosuvastatin 10 mg daily.  Lipid profile done today. Follow-up in 3 months.

## 2021-09-24 NOTE — Assessment & Plan Note (Signed)
Needs cognitive evaluation.  Neurology referral placed today.

## 2021-09-24 NOTE — Patient Instructions (Signed)
Diabetes mellitus y nutricin, en adultos Diabetes Mellitus and Nutrition, Adult Si sufre de diabetes, o diabetes mellitus, es muy importante tener hbitos alimenticios saludables debido a que sus niveles de azcar en la sangre (glucosa) se ven afectados en gran medida por lo que come y bebe. Comer alimentos saludables en las cantidades correctas, aproximadamente a la misma hora todos los das, lo ayudar a: Controlar su glucemia. Disminuir el riesgo de sufrir una enfermedad cardaca. Mejorar la presin arterial. Alcanzar o mantener un peso saludable. Qu puede afectar mi plan de alimentacin? Todas las personas que sufren de diabetes son diferentes y cada una tiene necesidades diferentes en cuanto a un plan de alimentacin. El mdico puede recomendarle que trabaje con un nutricionista para elaborar el mejor plan para usted. Su plan de alimentacin puede variar segn factores como: Las caloras que necesita. Los medicamentos que toma. Su peso. Sus niveles de glucemia, presin arterial y colesterol. Su nivel de actividad. Otras afecciones que tenga, como enfermedades cardacas o renales. Cmo me afectan los carbohidratos? Los carbohidratos, o hidratos de carbono, afectan su nivel de glucemia ms que cualquier otro tipo de alimento. La ingesta de carbohidratos aumenta la cantidad de glucosa en la sangre. Es importante conocer la cantidad de carbohidratos que se pueden ingerir en cada comida sin correr ningn riesgo. Esto es diferente en cada persona. Su nutricionista puede ayudarlo a calcular la cantidad de carbohidratos que debe ingerir en cada comida y en cada refrigerio. Cmo me afecta el alcohol? El alcohol puede provocar una disminucin de la glucemia (hipoglucemia), especialmente si usa insulina o toma determinados medicamentos por va oral para la diabetes. La hipoglucemia es una afeccin potencialmente mortal. Los sntomas de la hipoglucemia, como somnolencia, mareos y confusin, son  similares a los sntomas de haber consumido demasiado alcohol. No beba alcohol si: Su mdico le indica no hacerlo. Est embarazada, puede estar embarazada o est tratando de quedar embarazada. Si bebe alcohol: Limite la cantidad que bebe a lo siguiente: De 0 a 1 medida por da para las mujeres. De 0 a 2 medidas por da para los hombres. Sepa cunta cantidad de alcohol hay en las bebidas que toma. En los [place], una medida equivale a una botella de cerveza de 12 oz (355 ml), un vaso de vino de 5 oz (148 ml) o un vaso de una bebida alcohlica de alta graduacin de 1 oz (44 ml). Mantngase hidratado bebiendo agua, refrescos dietticos o t helado sin azcar. Tenga en cuenta que los refrescos comunes, los jugos y otras bebidas para mezclar pueden contener mucha azcar y se deben contar como carbohidratos. Consejos para seguir este plan  Leer las etiquetas de los alimentos Comience por leer el tamao de la porcin en la etiqueta de Informacin nutricional de los alimentos envasados y las bebidas. La cantidad de caloras, carbohidratos, grasas y otros nutrientes detallados en la etiqueta se basan en una porcin del alimento. Muchos alimentos contienen ms de una porcin por envase. Verifique la cantidad total de gramos (g) de carbohidratos totales en una porcin. Verifique la cantidad de gramos de grasas saturadas y grasas trans en una porcin. Escoja alimentos que no contengan estas grasas o que su contenido de estas sea bajo. Verifique la cantidad de miligramos (mg) de sal (sodio) en una porcin. La mayora de las personas deben limitar la ingesta de sodio total a menos de 2300 mg por da. Siempre consulte la informacin nutricional de los alimentos etiquetados como "con bajo contenido de grasa" o "sin grasa".   Estos alimentos pueden tener un mayor contenido de azcar agregada o carbohidratos refinados, y deben evitarse. Hable con su nutricionista para identificar sus objetivos diarios en  cuanto a los nutrientes mencionados en la etiqueta. Al ir de compras Evite comprar alimentos procesados, enlatados o precocidos. Estos alimentos tienden a tener una mayor cantidad de grasa, sodio y azcar agregada. Compre en la zona exterior de la tienda de comestibles. Esta es la zona donde se encuentran con mayor frecuencia las frutas y las verduras frescas, los cereales a granel, las carnes frescas y los productos lcteos frescos. Al cocinar Use mtodos de coccin a baja temperatura, como hornear, en lugar de mtodos de coccin a alta temperatura, como frer en abundante aceite. Cocine con aceites saludables, como el aceite de oliva, canola o girasol. Evite cocinar con manteca, crema o carnes con alto contenido de grasa. Planificacin de las comidas Coma las comidas y los refrigerios regularmente, preferentemente a la misma hora todos los das. Evite pasar largos perodos de tiempo sin comer. Consuma alimentos ricos en fibra, como frutas frescas, verduras, frijoles y cereales integrales. Consuma entre 4 y 6 onzas (entre 112 y 168 g) de protenas magras por da, como carnes magras, pollo, pescado, huevos o tofu. Una onza (oz) (28 g) de protena magra equivale a: 1 onza (28 g) de carne, pollo o pescado. 1 huevo.  taza (62 g) de tofu. Coma algunos alimentos por da que contengan grasas saludables, como aguacates, frutos secos, semillas y pescado. Qu alimentos debo comer? Frutas Bayas. Manzanas. Naranjas. Duraznos. Damascos. Ciruelas. Uvas. Mangos. Papayas. Granadas. Kiwi. Cerezas. Verduras Verduras de hoja verde, que incluyen lechuga, espinaca, col rizada, acelga, hojas de berza, hojas de mostaza y repollo. Remolachas. Coliflor. Brcoli. Zanahorias. Judas verdes. Tomates. Pimientos. Cebollas. Pepinos. Coles de Bruselas. Granos Granos integrales, como panes, galletas, tortillas, cereales y pastas de salvado o integrales. Avena sin azcar. Quinua. Arroz integral o salvaje. Carnes y otras  protenas Frutos de mar. Carne de ave sin piel. Cortes magros de ave y carne de res. Tofu. Frutos secos. Semillas. Lcteos Productos lcteos sin grasa o con bajo contenido de grasa, como leche, yogur y queso. Es posible que los productos detallados arriba no constituyan una lista completa de los alimentos y las bebidas que puede tomar. Consulte a un nutricionista para obtener ms informacin. Qu alimentos debo evitar? Frutas Frutas enlatadas al almbar. Verduras Verduras enlatadas. Verduras congeladas con mantequilla o salsa de crema. Granos Productos elaborados con harina y harina blanca refinada, como panes, pastas, bocadillos y cereales. Evite todos los alimentos procesados. Carnes y otras protenas Cortes de carne con alto contenido de grasa. Carne de ave con piel. Carnes empanizadas o fritas. Carne procesada. Evite las grasas saturadas. Lcteos Yogur, queso o leche enteros. Bebidas Bebidas azucaradas, como gaseosas o t helado. Es posible que los productos que se enumeran ms arriba no constituyan una lista completa de los alimentos y las bebidas que debe evitar. Consulte a un nutricionista para obtener ms informacin. Preguntas para hacerle al mdico Debo consultar con un especialista certificado en atencin y educacin sobre la diabetes? Es necesario que me rena con un nutricionista? A qu nmero puedo llamar si tengo preguntas? Cules son los mejores momentos para controlar la glucemia? Dnde encontrar ms informacin: American Diabetes Association (Asociacin Estadounidense de la Diabetes): diabetes.org Academy of Nutrition and Dietetics (Academia de Nutricin y Diettica): eatright.org National Institute of Diabetes and Digestive and Kidney Diseases (Instituto Nacional de la Diabetes y las Enfermedades Digestivas y Renales): niddk.nih.gov Association of Diabetes   Care & Education Specialists (Asociacin de Especialistas en Atencin y Educacin sobre la Diabetes):  diabeteseducator.org Resumen Es importante tener hbitos alimenticios saludables debido a que sus niveles de azcar en la sangre (glucosa) se ven afectados en gran medida por lo que come y bebe. Es importante consumir alcohol con prudencia. Un plan de comidas saludable lo ayudar a controlar la glucosa en sangre y a reducir el riesgo de enfermedades cardacas. El mdico puede recomendarle que trabaje con un nutricionista para elaborar el mejor plan para usted. Esta informacin no tiene como fin reemplazar el consejo del mdico. Asegrese de hacerle al mdico cualquier pregunta que tenga. Document Revised: 12/15/2019 Document Reviewed: 12/15/2019 Elsevier Patient Education  2023 Elsevier Inc.  

## 2021-09-24 NOTE — Progress Notes (Signed)
Tracie Burton 61 y.o.   Chief Complaint  Patient presents with   TRANSFER OF CARE   Headache   Back Pain    HISTORY OF PRESENT ILLNESS: This is a 61 y.o. female used to see Marvis Repress, NP, here to establish care with me. Diabetic and hypertensive. Occasional headache with neck pain and back pain. Also complaining of memory changes and deficits. No other complaint or medical concerns today. Lab Results  Component Value Date   HGBA1C 6.7 (H) 05/29/2021     HPI   Prior to Admission medications   Medication Sig Start Date End Date Taking? Authorizing Provider  Dulaglutide (TRULICITY) 3.53 IR/4.4RX SOPN ADMINISTER 0.75 MG UNDER THE SKIN 1 TIME A WEEK 06/28/21  Yes [name], FNP  mupirocin ointment (BACTROBAN) 2 % Apply 1 application topically 2 (two) times daily. 05/29/21  Yes [name], FNP  nystatin-triamcinolone [facility]) cream Apply 1 application topically 2 (two) times daily. 10/17/20  Yes [name], FNP  valsartan (DIOVAN) 160 MG tablet TAKE 1 TABLET(160 MG) BY MOUTH DAILY 07/13/21  Yes [name], FNP    No Known Allergies  Patient Active Problem List   Diagnosis Date Noted   Screening for cervical cancer 08/01/2017   Constipation 08/01/2017   Headache 07/01/2017   [facility] health examination 07/01/2017    Past Medical History:  Diagnosis Date   Endometrial polyp    Hypertension    Type 2 diabetes mellitus treated with insulin ([place])    followed by pcp    Past Surgical History:  Procedure Laterality Date   [place] History   Socioeconomic History   Marital status: Single    Spouse name: Not on file   Number of children: Not on file   Years of education: Not on file   Highest education level: Not on file  Occupational History   Not on file  Tobacco Use   Smoking status: Former   Smokeless tobacco:  Never  Vaping Use   Vaping Use: Never used  Substance and Sexual Activity   Alcohol use: Never   Drug use: Never   Sexual activity: Not Currently    Comment: 1st intercourse 61 yo-Fewer than 5 partners  Other Topics Concern   Not on file  Social History Narrative   Not on file   Social Determinants of Health   Financial Resource Strain: Not on file  Food Insecurity: Not on file  Transportation Needs: Not on file  Physical Activity: Not on file  Stress: Not on file  Social Connections: Not on file  Intimate Partner Violence: Not on file    Family History  Problem Relation Age of Onset   Breast cancer Mother 49   Diabetes Father      Review of Systems  Constitutional: Negative.  Negative for chills and fever.  HENT: Negative.  Negative for congestion and sore throat.   Respiratory: Negative.  Negative for cough and shortness of breath.   Cardiovascular: Negative.  Negative for chest pain and palpitations.  Gastrointestinal:  Negative for abdominal pain, blood in stool, diarrhea, melena, nausea and vomiting.  Genitourinary: Negative.  Negative for dysuria and hematuria.  Musculoskeletal:  Positive for neck pain (Chronic neck pain).       Chronic feet pain  Skin: Negative.  Negative for rash.  Neurological:  Negative for dizziness and headaches.  Memory issues  All other systems reviewed and are negative. Today's Vitals   09/24/21 1446  BP: 134/82  Pulse: 70  Temp: 98 F (36.7 C)  TempSrc: Oral  SpO2: 96%  Weight: 226 lb 8 oz (102.7 kg)  Height: '5\' 6"'$  (1.676 m)   Body mass index is 36.56 kg/m. Wt Readings from Last 3 Encounters:  09/24/21 226 lb 8 oz (102.7 kg)  05/29/21 228 lb 12.8 oz (103.8 kg)  10/17/20 230 lb 12.8 oz (104.7 kg)     Physical Exam Vitals reviewed.  Constitutional:      Appearance: She is well-developed.  HENT:     Head: Normocephalic.     Mouth/Throat:     Mouth: Mucous membranes are moist.     Pharynx: Oropharynx is clear.   Eyes:     Extraocular Movements: Extraocular movements intact.     Conjunctiva/sclera: Conjunctivae normal.     Pupils: Pupils are equal, round, and reactive to light.  Cardiovascular:     Rate and Rhythm: Normal rate and regular rhythm.     Pulses: Normal pulses.     Heart sounds: Normal heart sounds.  Pulmonary:     Effort: Pulmonary effort is normal.     Breath sounds: Normal breath sounds.  Abdominal:     General: There is no distension.     Palpations: Abdomen is soft.     Tenderness: There is no abdominal tenderness.  Musculoskeletal:        General: Normal range of motion.     Cervical back: No tenderness.     Right lower leg: No edema.     Left lower leg: No edema.  Lymphadenopathy:     Cervical: No cervical adenopathy.  Skin:    General: Skin is warm and dry.     Capillary Refill: Capillary refill takes less than 2 seconds.  Neurological:     General: No focal deficit present.     Mental Status: She is alert and oriented to person, place, and time.  Psychiatric:        Mood and Affect: Mood normal.        Behavior: Behavior normal.    Results for orders placed or performed in visit on 09/24/21 (from the past 24 hour(s))  POCT HgB A1C     Status: Abnormal   Collection Time: 09/24/21  2:57 PM  Result Value Ref Range   Hemoglobin A1C 6.3 (A) 4.0 - 5.6 %   HbA1c POC (<> result, manual entry)     HbA1c, POC (prediabetic range)     HbA1c, POC (controlled diabetic range)      ASSESSMENT & PLAN: A total of 60 minutes was spent with the patient and counseling/coordination of care regarding preparing for this visit, review of most recent office visit notes, establishing care with me, review of multiple chronic medical problems and their management, review of all medications, cardiovascular risks associated with hypertension and diabetes, review of health maintenance items, education on nutrition, prognosis, documentation, and need for follow-up.  Problem List Items  Addressed This Visit       Cardiovascular and Mediastinum   Hypertension associated with diabetes ([place]) - Primary    Well-controlled hypertension.  Continue valsartan 160 mg daily. BP Readings from Last 3 Encounters:  09/24/21 134/82  05/29/21 124/80  10/17/20 (!) 160/100  Dietary approaches to stop hypertension discussed Well-controlled diabetes with hemoglobin A1c of 6.3 Lab Results  Component Value Date   HGBA1C 6.3 (A) 09/24/2021  Diet  and nutrition discussed. Tolerating Trulicity very well.  Will increase dose to 1.5 mg weekly. Cardiovascular risks associated with hypertension and diabetes discussed. We will start rosuvastatin 10 mg daily.  Lipid profile done today. Follow-up in 3 months.        Relevant Medications   valsartan (DIOVAN) 160 MG tablet   rosuvastatin (CRESTOR) 10 MG tablet   Dulaglutide (TRULICITY) 1.5 DJ/5.7SV SOPN   Other Relevant Orders   POCT HgB A1C (Completed)   Comprehensive metabolic panel   Lipid panel   Urine Microalbumin w/creat. ratio     Other   Memory changes    Needs cognitive evaluation.  Neurology referral placed today.       Relevant Orders   Ambulatory referral to Neurology   Other Visit Diagnoses     Encounter to establish care          Patient Instructions  Diabetes mellitus y nutricin, en adultos Diabetes Mellitus and Nutrition, Adult Si sufre de diabetes, o diabetes mellitus, es muy importante tener hbitos alimenticios saludables debido a que sus niveles de Designer, television/film set sangre (glucosa) se ven afectados en gran medida por lo que come y bebe. [place] alimentos saludables en las cantidades correctas, aproximadamente a la misma hora todos los [place], [place] ayudar a: Chief Technology Officer su glucemia. Disminuir el riesgo de sufrir una enfermedad cardaca. Mejorar la presin arterial. Science writer o mantener un peso saludable. Qu puede afectar mi plan de alimentacin? Todas las personas que sufren de diabetes son diferentes y cada una  tiene necesidades diferentes en cuanto a un plan de alimentacin. El mdico puede recomendarle que trabaje con un nutricionista para elaborar el mejor plan para usted. Su plan de alimentacin puede variar segn factores como: Las caloras que necesita. Los medicamentos que toma. Su peso. Sus niveles de glucemia, presin arterial y colesterol. Su nivel de [place]. Otras afecciones que tenga, como enfermedades cardacas o renales. Cmo me afectan los carbohidratos? Los carbohidratos, o hidratos de carbono, afectan su nivel de glucemia ms que cualquier otro tipo de alimento. La ingesta de carbohidratos aumenta la cantidad de [employer]. Es importante conocer la cantidad de carbohidratos que se pueden ingerir en cada comida sin correr Engineer, manufacturing. Esto es Psychologist, forensic. Su nutricionista puede ayudarlo a calcular la cantidad de carbohidratos que debe ingerir en cada comida y en cada refrigerio. Cmo me afecta el alcohol? El alcohol puede provocar una disminucin de la glucemia (hipoglucemia), especialmente si [place] insulina o toma determinados medicamentos por va oral para la diabetes. La hipoglucemia es una afeccin potencialmente mortal. Los sntomas de la hipoglucemia, como somnolencia, mareos y confusin, son similares a los sntomas de haber consumido demasiado alcohol. No beba alcohol si: Su mdico le indica no hacerlo. Est embarazada, puede estar embarazada o est tratando de [place]. Si bebe alcohol: Limite la cantidad que bebe a lo siguiente: De 0 a 1 medida por da para las mujeres. De 0 a 2 medidas por da para los hombres. Sepa cunta cantidad de alcohol hay en las bebidas que toma. En los [place], una medida equivale a una botella de cerveza de 12 oz (355 ml), un vaso de vino de 5 oz (148 ml) o un vaso de una bebida alcohlica de alta graduacin de 1 oz (44 ml). Mantngase hidratado bebiendo agua, refrescos dietticos o t helado sin azcar.  Tenga en cuenta que los refrescos comunes, los jugos y otras bebidas para mezclar pueden contener Freight forwarder y se deben contar  como carbohidratos. Consejos para seguir Company secretary las etiquetas de los alimentos Comience por leer el tamao de la porcin en la etiqueta de Informacin nutricional de los alimentos envasados y las bebidas. La cantidad de caloras, carbohidratos, grasas y otros nutrientes detallados en la etiqueta se basan en una porcin del alimento. Muchos alimentos contienen ms de una porcin por envase. Verifique la cantidad total de gramos (g) de carbohidratos totales en una porcin. Verifique la cantidad de gramos de grasas saturadas y grasas trans en una porcin. Escoja alimentos que no contengan estas grasas o que su contenido de estas sea [place]. Verifique la cantidad de miligramos (mg) de sal (sodio) en una porcin. [place] [employer] de las personas deben limitar la ingesta de sodio total a menos de 2300 mg Honeywell. Siempre consulte la informacin nutricional de los alimentos etiquetados como "con bajo contenido de grasa" o "sin grasa". Estos alimentos pueden tener un mayor contenido de Location manager agregada o carbohidratos refinados, y deben evitarse. Hable con su nutricionista para identificar sus objetivos diarios en cuanto a los nutrientes mencionados en la etiqueta. Al ir de compras Evite comprar alimentos procesados, enlatados o precocidos. Estos alimentos tienden a Special educational needs teacher mayor cantidad de [place], sodio y azcar agregada. Compre en la zona exterior de la tienda de comestibles. Esta es la zona donde se encuentran con mayor frecuencia las frutas y las verduras frescas, los cereales a granel, las carnes frescas y los productos lcteos frescos. Al cocinar Use mtodos de coccin a baja temperatura, como hornear, en lugar de mtodos de coccin a alta temperatura, como frer en abundante aceite. Cocine con aceites saludables, como el aceite de [employer], canola o [place]. Evite cocinar con  manteca, crema o carnes con alto contenido de grasa. Planificacin de las comidas Coma las comidas y los refrigerios regularmente, preferentemente a la misma hora todos [place]. Evite pasar largos perodos de tiempo sin comer. Consuma alimentos ricos en fibra, como frutas frescas, verduras, frijoles y cereales integrales. Consuma entre 4 y 6 onzas (entre 112 y 168 g) de protenas magras por da, como carnes [place], pollo, pescado, huevos o tofu. Una onza (oz) (28 g) de protena magra equivale a: 1 onza (28 g) de carne, pollo o pescado. 1 huevo.  taza (62 g) de tofu. Coma algunos alimentos por da que contengan grasas saludables, como aguacates, frutos secos, semillas y pescado. Qu alimentos debo comer? Lambert Mody Bayas. Manzanas. Naranjas. Duraznos. Damascos. Ciruelas. Uvas. Mangos. Papayas. Granadas. Kiwi. Cerezas. Verduras Verduras de Boeing, que incluyen [place], [place], col rizada, acelga, hojas de berza, hojas de mostaza y repollo. Remolachas. Coliflor. Brcoli. Zanahorias. Judas verdes. Tomates. Pimientos. Cebollas. Pepinos. Coles de Bruselas. Granos Granos integrales, como panes, galletas, tortillas, cereales y pastas de salvado o integrales. Avena sin azcar. Quinua. Arroz integral o salvaje. Carnes y otras protenas Frutos de mar. Carne de ave sin piel. Cortes magros de ave y carne de res. Tofu. Frutos secos. Semillas. Lcteos Productos lcteos sin grasa o con bajo contenido de [place], [place], yogur y [place]. Es posible que los productos detallados arriba no constituyan una lista completa de los alimentos y las bebidas que puede tomar. Consulte a un nutricionista para obtener ms informacin. Qu alimentos debo evitar? Lambert Mody Frutas enlatadas al almbar. Verduras Verduras enlatadas. Verduras congeladas con mantequilla o salsa de crema. Granos Productos elaborados con [place] y [place], como panes, pastas, bocadillos y cereales. Evite todos los alimentos  procesados. Carnes y otras protenas Cortes de carne con alto  contenido de [place]. Carne de ave con piel. Carnes empanizadas o fritas. Carne procesada. Evite las grasas saturadas. Lcteos Yogur, [place] enteros. Bebidas Bebidas azucaradas, como gaseosas o t helado. Es posible que los productos que se enumeran ms [place] no constituyan una lista completa de los alimentos y las bebidas que Nurse, adult. Consulte a un nutricionista para obtener ms informacin. Preguntas para hacerle al mdico Debo consultar con un especialista certificado en atencin y educacin sobre la diabetes? Es necesario que me rena con un nutricionista? A qu nmero puedo llamar si tengo preguntas? Cules son los mejores momentos para controlar la glucemia? Dnde encontrar ms informacin: American Diabetes Association (Asociacin Estadounidense de la Diabetes): diabetes.org Academy of Nutrition and Dietetics (Academia de Nutricin y Information systems manager): eatright.Unisys Corporation of Diabetes and Digestive and Kidney Diseases ([place] la Diabetes y [place] y Renales): AmenCredit.is Association of Diabetes Care & Education Specialists (Asociacin de Especialistas en Atencin y Educacin sobre la Diabetes): diabeteseducator.org Resumen Es importante tener hbitos alimenticios saludables debido a que sus niveles de Designer, television/film set sangre (glucosa) se ven afectados en gran medida por lo que come y bebe. Es importante consumir alcohol con prudencia. Un plan de comidas saludable lo ayudar a controlar la glucosa en sangre y a reducir el riesgo de enfermedades cardacas. El mdico puede recomendarle que trabaje con un nutricionista para elaborar el mejor plan para usted. Esta informacin no tiene Marine scientist el consejo del mdico. Asegrese de hacerle al mdico cualquier pregunta que tenga. Document Revised: 12/15/2019 Document Reviewed: 12/15/2019 Elsevier Patient Education  [place], MD [facility]

## 2021-10-01 ENCOUNTER — Ambulatory Visit: Payer: 59 | Admitting: Physician Assistant

## 2021-10-01 ENCOUNTER — Other Ambulatory Visit (INDEPENDENT_AMBULATORY_CARE_PROVIDER_SITE_OTHER): Payer: 59

## 2021-10-01 ENCOUNTER — Encounter: Payer: Self-pay | Admitting: Physician Assistant

## 2021-10-01 VITALS — BP 156/73 | HR 73 | Resp 20 | Ht 68.0 in | Wt 229.0 lb

## 2021-10-01 DIAGNOSIS — G3184 Mild cognitive impairment, so stated: Secondary | ICD-10-CM

## 2021-10-01 DIAGNOSIS — R413 Other amnesia: Secondary | ICD-10-CM | POA: Diagnosis not present

## 2021-10-01 LAB — VITAMIN B12: Vitamin B-12: 331 pg/mL (ref 211–911)

## 2021-10-01 LAB — TSH: TSH: 1.61 u[IU]/mL (ref 0.35–5.50)

## 2021-10-01 NOTE — Progress Notes (Signed)
Please tell the patient to replenish B12 over the counter, 1000 mcg a day, to maintain good B12 levels. Her thyroid levels are normal, thanks

## 2021-10-01 NOTE — Patient Instructions (Addendum)
It was a pleasure to see you today at our office.   Recommendations:  Follow up July 31 at 9:30  MRI of the brain, the radiology office will call you to arrange you appointment [facility]    Whom to call:  Memory  decline, memory medications: Call our office [phone]   For psychiatric meds, mood meds: Please have your primary care physician manage these medications.   Counseling regarding caregiver distress, including caregiver depression, anxiety and issues regarding community resources, adult day care programs, adult living facilities, or memory care questions:   Feel free to contact [place], Social Worker at [phone]   For assessment of decision of mental capacity and competency:  Call Dr. Anthoney Harada, geriatric psychiatrist at [phone]  For guidance in geriatric dementia issues please call [facility] [phone]  For guidance regarding [facility] and if placement were needed at the facility, contact Arnell Asal, Social Worker tel: [phone]  If you have any severe symptoms of a stroke, or other severe issues such as confusion,severe chills or fever, etc call 911 or go to the ER as you may need to be evaluated further   Feel free to visit Facebook page " Inspo" for tips of how to care for people with memory problems.   Feel free to go to the following database for funded clinical studies conducted around the world: http://saunders.com/   https://www.triadclinicaltrials.com/     RECOMMENDATIONS FOR ALL PATIENTS WITH MEMORY PROBLEMS: 1. Continue to exercise (Recommend 30 minutes of walking everyday, or 3 hours every week) 2. Increase social interactions - continue going to [place] and enjoy social gatherings with friends and family 3. Eat healthy, avoid fried foods and eat more fruits and vegetables 4. Maintain adequate blood pressure, blood sugar, and blood  cholesterol level. Reducing the risk of stroke and cardiovascular disease also helps promoting better memory. 5. Avoid stressful situations. Live a simple life and avoid aggravations. Organize your time and prepare for the next day in anticipation. 6. Sleep well, avoid any interruptions of sleep and avoid any distractions in the bedroom that may interfere with adequate sleep quality 7. Avoid sugar, avoid sweets as there is a strong link between excessive sugar intake, diabetes, and cognitive impairment We discussed the Mediterranean diet, which has been shown to help patients reduce the risk of progressive memory disorders and reduces cardiovascular risk. This includes eating fish, eat fruits and green leafy vegetables, nuts like almonds and hazelnuts, walnuts, and also use olive oil. Avoid fast foods and fried foods as much as possible. Avoid sweets and sugar as sugar use has been linked to worsening of memory function.  There is always a concern of gradual progression of memory problems. If this is the case, then we may need to adjust level of care according to patient needs. Support, both to the patient and caregiver, should then be put into place.      You have been referred for a neuropsychological evaluation (i.e., evaluation of memory and thinking abilities). Please bring someone with you to this appointment if possible, as it is helpful for the doctor to hear from both you and another adult who knows you well. Please bring eyeglasses and hearing aids if you wear them.    The evaluation will take approximately 3 hours and has two parts:   The first part is a clinical interview with the neuropsychologist (Dr. Melvyn Novas or Dr. Nicole Kindred). During the interview, the neuropsychologist  will speak with you and the individual you brought to the appointment.    The second part of the evaluation is testing with the doctor's technician Hinton Dyer or Maudie Mercury). During the testing, the technician will ask you to remember  different types of material, solve problems, and answer some questionnaires. Your family member will not be present for this portion of the evaluation.   Please note: We must reserve several hours of the neuropsychologist's time and the psychometrician's time for your evaluation appointment. As such, there is a No-Show fee of $100. If you are unable to attend any of your appointments, please contact our office as soon as possible to reschedule.    FALL PRECAUTIONS: Be cautious when walking. Scan the area for obstacles that may increase the risk of trips and falls. When getting up in the mornings, sit up at the edge of the bed for a few minutes before getting out of bed. Consider elevating the bed at the head end to avoid drop of blood pressure when getting up. Walk always in a well-lit room (use night lights in the walls). Avoid area rugs or power cords from appliances in the middle of the walkways. Use a walker or a cane if necessary and consider physical therapy for balance exercise. Get your eyesight checked regularly.  FINANCIAL OVERSIGHT: Supervision, especially oversight when making financial decisions or transactions is also recommended.  HOME SAFETY: Consider the safety of the kitchen when operating appliances like stoves, microwave oven, and blender. Consider having supervision and share cooking responsibilities until no longer able to participate in those. Accidents with firearms and other hazards in the house should be identified and addressed as well.   ABILITY TO BE LEFT ALONE: If patient is unable to contact 911 operator, consider using LifeLine, or when the need is there, arrange for someone to stay with patients. Smoking is a fire hazard, consider supervision or cessation. Risk of wandering should be assessed by caregiver and if detected at any point, supervision and safe proof recommendations should be instituted.  MEDICATION SUPERVISION: Inability to self-administer medication needs  to be constantly addressed. Implement a mechanism to ensure safe administration of the medications.   DRIVING: Regarding driving, in patients with progressive memory problems, driving will be impaired. We advise to have someone else do the driving if trouble finding directions or if minor accidents are reported. Independent driving assessment is available to determine safety of driving.   If you are interested in the driving assessment, you can contact the following:  [employer] in [place]  [place] [phone] or [phone]    [place] refers to food and lifestyle choices that are based on the traditions of countries located on the [employer]. This way of eating has been shown to help prevent certain conditions and improve outcomes for people who have chronic diseases, like kidney disease and heart disease. What are tips for following this plan? Lifestyle  Cook and eat meals together with your family, when possible. Drink enough fluid to keep your urine clear or pale yellow. Be physically active every day. This includes: Aerobic exercise like running or swimming. Leisure activities like gardening, walking, or housework. Get 7-8 hours of sleep each night. If recommended by your health care provider, drink red wine in moderation. This means 1 glass a day for nonpregnant women and 2 glasses a day for men. A glass of wine equals 5  oz (150 mL). Reading food labels  Check the serving size of packaged foods. For foods such as rice and pasta, the serving size refers to the amount of cooked product, not dry. Check the total fat in packaged foods. Avoid foods that have saturated fat or trans fats. Check the ingredients list for added sugars, such as corn syrup. Shopping  At the grocery store, buy most of your food from the areas near  the walls of the store. This includes: Fresh fruits and vegetables (produce). Grains, beans, nuts, and seeds. Some of these may be available in unpackaged forms or large amounts (in bulk). Fresh seafood. Poultry and eggs. Low-fat dairy products. Buy whole ingredients instead of prepackaged foods. Buy fresh fruits and vegetables in-season from local farmers markets. Buy frozen fruits and vegetables in resealable bags. If you do not have access to quality fresh seafood, buy precooked frozen shrimp or canned fish, such as tuna, salmon, or sardines. Buy small amounts of raw or cooked vegetables, salads, or olives from the deli or salad bar at your store. Stock your pantry so you always have certain foods on hand, such as olive oil, canned tuna, canned tomatoes, rice, pasta, and beans. Cooking  Cook foods with extra-virgin olive oil instead of using butter or other vegetable oils. Have meat as a side dish, and have vegetables or grains as your main dish. This means having meat in small portions or adding small amounts of meat to foods like pasta or stew. Use beans or vegetables instead of meat in common dishes like chili or lasagna. Experiment with different cooking methods. Try roasting or broiling vegetables instead of steaming or sauteing them. Add frozen vegetables to soups, stews, pasta, or rice. Add nuts or seeds for added healthy fat at each meal. You can add these to yogurt, salads, or vegetable dishes. Marinate fish or vegetables using olive oil, lemon juice, garlic, and fresh herbs. Meal planning  Plan to eat 1 vegetarian meal one day each week. Try to work up to 2 vegetarian meals, if possible. Eat seafood 2 or more times a week. Have healthy snacks readily available, such as: Vegetable sticks with hummus. Greek yogurt. Fruit and nut trail mix. Eat balanced meals throughout the week. This includes: Fruit: 2-3 servings a day Vegetables: 4-5 servings a day Low-fat dairy: 2 servings  a day Fish, poultry, or lean meat: 1 serving a day Beans and legumes: 2 or more servings a week Nuts and seeds: 1-2 servings a day Whole grains: 6-8 servings a day Extra-virgin olive oil: 3-4 servings a day Limit red meat and sweets to only a few servings a month What are my food choices? Mediterranean diet Recommended Grains: Whole-grain pasta. Brown rice. Bulgar wheat. Polenta. Couscous. Whole-wheat bread. [place]. Vegetables: Artichokes. Beets. Broccoli. Cabbage. Carrots. Eggplant. Green beans. Chard. Kale. Spinach. Onions. Leeks. Peas. Squash. Tomatoes. Peppers. Radishes. Fruits: Apples. Apricots. Avocado. Berries. Bananas. Cherries. Dates. Figs. Grapes. Lemons. Melon. Oranges. Peaches. Plums. Pomegranate. Meats and other protein foods: Beans. Almonds. Sunflower seeds. Pine nuts. Peanuts. [place]. Salmon. Scallops. Shrimp. [place]. Tilapia. Clams. Oysters. Eggs. Dairy: Low-fat milk. Cheese. Greek yogurt. Beverages: Water. Red wine. Herbal tea. Fats and oils: Extra virgin olive oil. Avocado oil. Grape seed oil. Sweets and desserts: [place] yogurt with honey. Baked apples. Poached pears. Trail mix. Seasoning and other foods: Basil. Cilantro. Coriander. Cumin. Mint. Parsley. Sage. Rosemary. Tarragon. Garlic. Oregano. Thyme. Pepper. Balsalmic vinegar. Tahini. Hummus. Tomato sauce. Olives. Mushrooms. Limit these Grains: Prepackaged pasta or rice dishes.  Prepackaged cereal with added sugar. Vegetables: Deep fried potatoes (french fries). Fruits: Fruit canned in syrup. Meats and other protein foods: Beef. Pork. Lamb. Poultry with skin. Hot dogs. Berniece Salines. Dairy: Ice cream. Sour cream. Whole milk. Beverages: Juice. Sugar-sweetened soft drinks. Beer. Liquor and spirits. Fats and oils: Butter. Canola oil. Vegetable oil. Beef fat (tallow). Lard. Sweets and desserts: Cookies. Cakes. Pies. Candy. Seasoning and other foods: Mayonnaise. Premade sauces and marinades. The items listed may not be a  complete list. Talk with your dietitian about what dietary choices are right for you. Summary The Mediterranean diet includes both food and lifestyle choices. Eat a variety of fresh fruits and vegetables, beans, nuts, seeds, and whole grains. Limit the amount of red meat and sweets that you eat. Talk with your health care provider about whether it is safe for you to drink red wine in moderation. This means 1 glass a day for nonpregnant women and 2 glasses a day for men. A glass of wine equals 5 oz (150 mL). This information is not intended to replace advice given to you by your health care provider. Make sure you discuss any questions you have with your health care provider. Document Released: 11/30/2015 Document Revised: 01/02/2016 Document Reviewed: 11/30/2015 Elsevier Interactive Patient Education  2017 [employer].    We have sent a referral to [facility] for your MRI and they will call you directly to schedule your appointment. They are located at [place]. If you need to contact them directly please call [phone].  Your provider has requested that you have labwork completed today. Please go to [facility] ([id number]) on the second floor of this building before leaving the office today. You do not need to check in. If you are not called within 15 minutes please check with the front desk.

## 2021-10-01 NOTE — Progress Notes (Signed)
Assessment/Plan:  The patient is seen in neurologic consultation at the request of Horald Pollen, * for the evaluation of memory.  Tracie Burton is a very pleasant  61 y.o. year old RH female with  a history of hypertension, diabetes, hyperlipidemia, chronic neck and back pain, meningitis at age 89, seen today for evaluation of memory loss. MoCA today is 24/30 with delayed recall 3/5. There is a language barrier, as her English is very limited, needing an interpreter. In addition the patient had undiagnosed, untreated PTSD and depression, which may influence her cognitive difficulties .    Recommendations:   MIld Cognitive Impairment, likely of multiple etiologies   MRI brain with/without contrast to assess for underlying structural abnormality and assess vascular load  Check B12, TSH Close control of cardiovascular risk factors I recommended Referral to [facility] for Depression and PTSD [facility] up in 1  month  No indication at this time for antidementia medication   Subjective:     The patient is here alone.    How long did patient have memory difficulties? " I always did, I had to always work harder to get the grade, but I was a good student". She reports having "lost the agility". Patient reports that as a child she had meningitis and "that had something to do with my memory" . Never was diagnosed with ADD/ADHD while in [place]. She does admit to have significant trouble multitasking. STM < LTM . If she reads new material, she cannot retain it. She is trying to study for her citizenship exam and " cannot recall what I learned 1 minute ago" Patient lives with daughter. She has 3 other children still in [place]  repeats oneself? Endorsed, for the last year Disoriented when walking into a room?  "I can't remember what I was coming to the room for"  Leaving objects in unusual places?  "All the time", for ex the remote control in the recorder. She knows she is easily  distracted  Ambulates  with difficulty?   Patient denies   Recent falls?  Patient denies  28 y ago fell off the moto and hurt her neck without LOC Any head injuries?  Patient denies   History of seizures?  Denies  Wandering behavior?  Patient denies   Patient drives? Never drove. She reports getting very anxious oat  the wheel so she avoids learning to drive, depends on  her daughter   Any mood changes such irritability agitation?  Patient denies   Any history of depression?:  She had never been diagnosed with depression but 20 years ago, while living [place] in [place] with her 3 children,"life changed in 1 minute". The cartel killed her husband and all her children except for one had to leave [place], and moved to other areas of [place] separately. She suffered significant PTSD after this event, she never recovered from. 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 She cries frequently about this i Hallucinations?  Patient denies   Paranoia?  Patient denies   Patient reports that he sleeps terrible without vivid dreams reliving the above traumatic experience., She does not know of REM behavior or sleepwalking    History of sleep apnea?  Patient denies   Any hygiene concerns?  Patient denies   Independent of bathing and dressing?  Endorsed  Does the patient needs help with medications?  She is  in charge and forgets doses sometimes Who is in charge of the finances? Daughter in charge  Any changes in appetite?  Patient denies   Patient have trouble swallowing? Sometimes due to GERD  Does the patient cook?  Patient denies   Any kitchen accidents such as leaving the stove on? Endorsed, 2 times in the last year  Any headaches?  Patient denies   The double vision? Patient denies   Any focal numbness or tingling?  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  Patient denies   Any tremors?  Patient denies   Any history of anosmia?  Patient denies   Any incontinence of urine?  Patient denies   Any bowel dysfunction?  Constipation   History of heavy alcohol intake?  Patient denies   History of heavy tobacco use?  Patient denies   Family history of dementia?  Patient denies   Pertinent labs hemoglobin A1c 6.3, B12 273     Past Medical History:  Diagnosis Date   Endometrial polyp    Hypertension    Type 2 diabetes mellitus treated with insulin ([place])    followed by pcp     Past Surgical History:  Procedure Laterality Date   [place]     No Known Allergies  Current Outpatient Medications  Medication Instructions   mupirocin ointment (BACTROBAN) 2 % 1 application , Topical, 2 times daily   nystatin-triamcinolone (MYCOLOG II) cream 1 application , Topical, 2 times daily   rosuvastatin (CRESTOR) 10 mg, Oral, Daily   Trulicity 1.5 mg, Subcutaneous, Weekly   valsartan (DIOVAN) 160 MG tablet TAKE 1 TABLET(160 MG) BY MOUTH DAILY     VITALS:   Vitals:   10/01/21 1019  BP: (!) 156/73  Pulse: 73  Resp: 20  SpO2: 97%  Weight: 229 lb (103.9 kg)  Height: '5\' 8"'$  (1.727 m)      09/24/2021    2:49 PM 05/29/2021    3:56 PM 10/17/2020    8:32 AM 04/28/2020    2:30 PM 07/31/2017    2:48 PM  Depression screen PHQ 2/9  Decreased Interest 0 0 0 0 0  Down, Depressed, Hopeless 0 0 0 0 0  PHQ - 2 Score 0 0 0 0 0    PHYSICAL EXAM   HEENT:  Normocephalic, atraumatic. The mucous membranes are moist. The superficial temporal arteries are without ropiness or tenderness. Cardiovascular: Regular rate and rhythm. Lungs: Clear to auscultation bilaterally. Neck: There are no carotid bruits noted bilaterally.  NEUROLOGICAL:    10/01/2021    8:00 PM   Montreal Cognitive Assessment   Visuospatial/ Executive (0/5) 4  Naming (0/3) 3  Attention: Read list of digits (0/2) 2  Attention: Read list of letters (0/1) 1  Attention: Serial 7 subtraction starting at 100 (0/3) 1  Language: Repeat phrase (0/2) 2  Language : Fluency (0/1) 1  Abstraction (0/2) 1  Delayed Recall (0/5) 3  Orientation (0/6) 6  Total 24  Adjusted Score (based on education) 24        No data to display           Orientation:  Alert and oriented to person, place and time. No aphasia or  dysarthria. Fund of knowledge is appropriate. Recent memory impaired and remote memory intact.  Attention reduced, and concentration are normal.  Able to name objects and repeat phrases. Delayed recall 3/5  Cranial nerves: There is good facial symmetry. Extraocular muscles are intact and visual fields are full to confrontational testing. Speech is fluent and clear. Soft palate rises symmetrically and there is no tongue deviation. Hearing is intact to conversational tone. Tone: Tone is good throughout. Sensation: Sensation is intact to light touch and pinprick throughout. Vibration is intact at the bilateral big toe.There is no extinction with double simultaneous stimulation. There is no sensory dermatomal level identified. Coordination: The patient has no difficulty with RAM's or FNF bilaterally. Normal finger to nose  Motor: Strength is 5/5 in the bilateral upper and lower extremities. There is no pronator drift. There are no fasciculations noted. DTR's: Deep tendon reflexes are 2/4 at the bilateral biceps, triceps, brachioradialis, patella and achilles.  Plantar responses are downgoing bilaterally. Gait and Station: The patient is able to ambulate without difficulty.The patient is able to heel toe walk without any difficulty.The patient is able to ambulate in a tandem fashion. The patient is able to stand in the Romberg position.    Thank you for allowing [place] the opportunity to  participate in the care of this nice patient. Please do not hesitate to contact us for any questions or concerns.   Total time spent on today's visit was 61 minutes dedicated to this patient today, preparing to see patient, examining the patient, ordering tests and/or medications and counseling the patient, documenting clinical information in the EHR or other health record, independently interpreting results and communicating results to the patient/family, discussing treatment and goals, answering patient's questions and coordinating care.  Cc:  Horald Pollen, MD  Sharene Butters 10/01/2021 8:46 PM

## 2021-10-09 ENCOUNTER — Other Ambulatory Visit: Payer: Self-pay

## 2021-10-09 ENCOUNTER — Telehealth: Payer: Self-pay | Admitting: Physician Assistant

## 2021-10-09 NOTE — Telephone Encounter (Signed)
Pt's daughter called in stating the place the pt was supposed to have her MRI done does not take her insurance. She needs a new referral sent somewhere else.

## 2021-10-09 NOTE — Telephone Encounter (Signed)
[facility] health, starting prior authorization to send to [facility], I spoke with daughter POA

## 2021-10-11 ENCOUNTER — Other Ambulatory Visit: Payer: Self-pay

## 2021-10-11 DIAGNOSIS — I152 Hypertension secondary to endocrine disorders: Secondary | ICD-10-CM

## 2021-10-11 DIAGNOSIS — R413 Other amnesia: Secondary | ICD-10-CM

## 2021-10-11 NOTE — Telephone Encounter (Signed)
Jessica from [facility] called to get the authorization number and also confirm the location is [facility].

## 2021-10-11 NOTE — Telephone Encounter (Signed)
Left message, it is [facility], not [facility] location

## 2021-11-05 ENCOUNTER — Ambulatory Visit (INDEPENDENT_AMBULATORY_CARE_PROVIDER_SITE_OTHER): Payer: 59

## 2021-11-05 DIAGNOSIS — R413 Other amnesia: Secondary | ICD-10-CM

## 2021-11-05 MED ORDER — GADOBUTROL 1 MMOL/ML IV SOLN
10.0000 mL | Freq: Once | INTRAVENOUS | Status: AC | PRN
  Administered 2021-11-05: 10 mL via INTRAVENOUS

## 2021-11-06 ENCOUNTER — Telehealth: Payer: Self-pay | Admitting: Physician Assistant

## 2021-11-06 NOTE — Telephone Encounter (Signed)
Cheryl from imaging facility called and left a voice mail requesting a call back for MRI results call report.

## 2021-11-06 NOTE — Telephone Encounter (Signed)
Called back for call report. 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

## 2021-11-07 NOTE — Telephone Encounter (Signed)
Patient advised of MRI results, voiced understanding

## 2021-11-08 NOTE — Progress Notes (Signed)
Please inform the patient that there is a tiny 3 mm growth in the right ER canal outside of the brain, that does not require any referrals at this time, but we will continue to monitor, other than that, just mild age-related changes, will repeat the MRI of the brain in 1 year

## 2021-11-19 ENCOUNTER — Ambulatory Visit: Payer: 59 | Admitting: Physician Assistant

## 2021-11-20 ENCOUNTER — Ambulatory Visit: Payer: 59 | Admitting: Physician Assistant

## 2021-11-20 ENCOUNTER — Encounter: Payer: Self-pay | Admitting: Physician Assistant

## 2021-11-20 VITALS — BP 119/76 | HR 80 | Resp 18 | Wt 221.0 lb

## 2021-11-20 DIAGNOSIS — G3184 Mild cognitive impairment, so stated: Secondary | ICD-10-CM | POA: Diagnosis not present

## 2021-11-20 DIAGNOSIS — D329 Benign neoplasm of meninges, unspecified: Secondary | ICD-10-CM | POA: Diagnosis not present

## 2021-11-20 NOTE — Progress Notes (Signed)
Assessment/Plan:   Mild Cognitive Impairment   Tracie Burton is a very pleasant 61 y.o. RH female with  a history of hypertension, diabetes, hyperlipidemia, chronic neck and back pain, meningitis at age 31 and PTSD presenting today in follow-up for evaluation of memory difficulties.  She is not on antidementia medication.  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  Last MoCA in June 2023 was 24/30.  Overall, her cognitive status is stable.    Recommendations:    Follow up on 3 mm meningioma in the right ear canal.  We will repeat MRI of the brain in 1 year.   Recommend good control of cardiovascular risk factors Recommend psychiatry for her PTSD and other psychiatric issues. No antidementia medication is indicated at this time Follow up in 6 months.   Case discussed with Dr. Delice Lesch who agrees with the plan     Subjective:   This patient is accompanied in the office by her daughter. Previous records as well as any outside records available were reviewed prior to todays visit.  Patient was last seen at our office on 09/2021  at which time her  MoCA was 24/30    Any changes in memory since last visit?  Denies.  Although short-term memory continues to be an issue, she feels that since she has recently changed jobs, she may be more keen to learn new material. Patient lives with: Her daughter. repeats oneself?  Endorsed Disoriented when walking into a room?  Patient denies   Leaving objects in unusual places?  Patient denies   Ambulates  with difficulty?   Patient denies   Recent falls?  No recent falls. Any head injuries?  Patient denies   History of seizures?   Patient denies   Wandering behavior?  Patient denies   Patient drives?   She never drove. Any mood changes such  irritability agitation?  Patient denies   Any depression?:  Endorsed.  She admits that she needs to have either psychotherapy or psychiatry to address her PTSD. Hallucinations?  Patient denies   Paranoia?  Patient denies   Patient reports that he sleeps well now that she has changed jobs, about 7 hours nightly, but she continues to have some vivid dreams although she reports that she feels more refreshed upon waking up.  She is not aware of REM behavior or sleepwalking.   History of sleep apnea?  Patient denies   Any hygiene concerns?  Patient denies   Independent of bathing and dressing?  Endorsed  Does the patient needs help with medications?  She is in charge of the medications. Who is in charge of the finances?  Daughter Is in charge    Any changes in appetite?  Patient denies   Patient have trouble swallowing? Patient denies   Does the patient cook?  Patient denies   Any kitchen accidents such as leaving the stove on? Patient denies   Any headaches?  Patient denies   Double vision? Patient denies   Any focal numbness or tingling?  Patient denies   Chronic back pain Patient denies   Unilateral weakness?  Patient denies   Any tremors?  Patient denies   Any history of anosmia?  Patient denies   Any incontinence of urine?  Patient denies   Any bowel dysfunction?  Occasional constipation.  MRI of the brain in June 2023 showed a 3 mm meningioma in the right ear canal chronic small vessel changes, and mild cerebellar atrophy,  Past Medical History:  Diagnosis Date   Endometrial polyp    Hypertension    Type 2 diabetes mellitus treated with insulin ([place])    followed by pcp     Past Surgical History:  Procedure Laterality Date   [place]ha:   CURRENT MEDICATIONS:  Outpatient Encounter Medications as of 11/20/2021  Medication Sig   Dulaglutide (TRULICITY) 1.5 JK/0.9FG SOPN Inject  1.5 mg into the skin once a week.   mupirocin ointment (BACTROBAN) 2 % Apply 1 application topically 2 (two) times daily.   nystatin-triamcinolone (MYCOLOG II) cream Apply 1 application topically 2 (two) times daily.   rosuvastatin (CRESTOR) 10 MG tablet Take 1 tablet (10 mg total) by mouth daily.   valsartan (DIOVAN) 160 MG tablet TAKE 1 TABLET(160 MG) BY MOUTH DAILY   No facility-administered encounter medications on file as of 11/20/2021.     Objective:     PHYSICAL EXAMINATION:    VITALS:   Vitals:   11/20/21 1438  BP: 119/76  Pulse: 80  Resp: 18  SpO2: 96%  Weight: 221 lb (100.2 kg)    GEN:  The patient appears stated age and is in NAD. HEENT:  Normocephalic, atraumatic.   Neurological examination:  General: NAD, well-groomed, appears stated age. Orientation: The patient is alert. Oriented to person, place and date Cranial nerves: There is good facial symmetry.The speech is fluent and clear. No aphasia or dysarthria. Fund of knowledge is appropriate. Recent memory impaired and remote memory is normal.  Attention and concentration are normal.  Able to name objects and repeat phrases.  Hearing is intact to conversational tone.    Sensation: Sensation is intact to light touch throughout Motor: Strength is at least antigravity x4. Tremors: none  DTR's 2/4 in UE/LE      10/01/2021    8:00 PM  Montreal Cognitive Assessment   Visuospatial/ Executive (0/5) 4  Naming (0/3) 3  Attention: Read list of digits (0/2) 2  Attention: Read list of letters (0/1) 1  Attention: Serial 7 subtraction starting at 100 (0/3) 1  Language: Repeat phrase (0/2) 2  Language : Fluency (0/1) 1  Abstraction (0/2) 1  Delayed Recall (0/5) 3  Orientation (0/6) 6  Total 24  Adjusted Score (based on education) 24        No data to display             Movement examination: Tone: There is normal tone in the UE/LE Abnormal movements:  no tremor.  No myoclonus.  No asterixis.   Coordination:   There is no decremation with RAM's. Normal finger to nose  Gait and Station: The patient has no difficulty arising out of a deep-seated chair without the use of the hands. The patient's stride length is good.  Gait is cautious and narrow.   Thank you for allowing [place] the opportunity to participate in the care of this nice patient. Please do not hesitate to contact us for any questions or concerns.   Total time spent on today's visit was 30 minutes dedicated to this patient today, preparing to see patient, examining the patient, ordering tests and/or medications and counseling the patient, documenting clinical information in the EHR or other health record, independently interpreting  results and communicating results to the patient/family, discussing treatment and goals, answering patient's questions and coordinating care.  Cc:  Horald Pollen, MD  Sharene Butters 11/20/2021 3:33 PM   Cc:  Horald Pollen, MD Sharene Butters, PA-C

## 2021-11-20 NOTE — Patient Instructions (Signed)
6 months

## 2021-12-18 ENCOUNTER — Ambulatory Visit (INDEPENDENT_AMBULATORY_CARE_PROVIDER_SITE_OTHER): Payer: Commercial Managed Care - HMO

## 2021-12-18 ENCOUNTER — Encounter: Payer: Self-pay | Admitting: Podiatry

## 2021-12-18 ENCOUNTER — Ambulatory Visit: Payer: Commercial Managed Care - HMO | Admitting: Podiatry

## 2021-12-18 DIAGNOSIS — G5761 Lesion of plantar nerve, right lower limb: Secondary | ICD-10-CM

## 2021-12-18 DIAGNOSIS — M778 Other enthesopathies, not elsewhere classified: Secondary | ICD-10-CM | POA: Diagnosis not present

## 2021-12-18 DIAGNOSIS — M7742 Metatarsalgia, left foot: Secondary | ICD-10-CM

## 2021-12-18 DIAGNOSIS — Q6671 Congenital pes cavus, right foot: Secondary | ICD-10-CM | POA: Diagnosis not present

## 2021-12-18 DIAGNOSIS — Q6672 Congenital pes cavus, left foot: Secondary | ICD-10-CM

## 2021-12-18 DIAGNOSIS — M7741 Metatarsalgia, right foot: Secondary | ICD-10-CM | POA: Diagnosis not present

## 2021-12-18 NOTE — Patient Instructions (Signed)
The pads are called metatarsal pads and can be bought on [employer]

## 2021-12-19 NOTE — Progress Notes (Signed)
  Subjective:  Patient ID: Tracie Burton, female    DOB: [birth year],  MRN: [id number]  Chief Complaint  Patient presents with   Diabetes    NP  diabetic with right foot pain, (A1C  6.3) Painful x 3 or 4 months, ball of foot and top of foot    61 y.o. female presents with the above complaint. History confirmed with patient.  Most of the pain is between the second and third toes of the right foot is severe  Objective:  Physical Exam: warm, good capillary refill, no trophic changes or ulcerative lesions, normal DP and PT pulses, normal sensory exam, and sharp pain on palpation of the plantar second interspace with a palpable click.  Pes cavus foot deformity   Radiographs: Multiple views x-ray of the right foot: no fracture, dislocation, swelling or degenerative changes noted pes cavus noted Assessment:   1. Morton's metatarsalgia, neuralgia, or neuroma, right   2. Metatarsalgia of both feet   3. Pes cavus of both feet      Plan:  Patient was evaluated and treated and all questions answered.   Interdigital Neuroma -Educated on etiology -Educated on padding and proper shoegear.  Metatarsal pads were dispensed -XR reviewed with patient -Injection delivered to the affected interspaces  Procedure: Neuroma Injection Location: Right second interspace Skin Prep: Alcohol. Injectate: 0.5 cc 0.5% marcaine plain, 0.5 cc 2% lidocaine plain, 5 mg Kenalog, 2 A dexamethasone phosphate. Disposition: Patient tolerated procedure well. Injection site dressed with a band-aid.   Return if symptoms worsen or fail to improve.

## 2022-01-24 ENCOUNTER — Other Ambulatory Visit: Payer: Self-pay | Admitting: Family

## 2022-01-24 DIAGNOSIS — E1169 Type 2 diabetes mellitus with other specified complication: Secondary | ICD-10-CM

## 2022-01-28 ENCOUNTER — Other Ambulatory Visit: Payer: Self-pay | Admitting: Family

## 2022-01-28 DIAGNOSIS — E1169 Type 2 diabetes mellitus with other specified complication: Secondary | ICD-10-CM

## 2022-02-27 ENCOUNTER — Telehealth: Payer: Self-pay | Admitting: Emergency Medicine

## 2022-02-27 NOTE — Telephone Encounter (Signed)
Patient request refill:  TRULICITY 1.5 MG  [id number] [place], [place] - [address] AT [address], [place] [address] Phone: [phone]  Fax: [phone]   Pt called from [facility] asking why they do not have the prescription for Trulicity.  Pt request return call as to why it was not sent before now. Ph [phone]

## 2022-02-27 NOTE — Telephone Encounter (Signed)
Called patient and left message for patient to call office in regards to medication question and refill

## 2022-05-21 ENCOUNTER — Other Ambulatory Visit: Payer: Self-pay | Admitting: Family

## 2022-05-21 ENCOUNTER — Telehealth: Payer: Self-pay | Admitting: Emergency Medicine

## 2022-05-21 DIAGNOSIS — I152 Hypertension secondary to endocrine disorders: Secondary | ICD-10-CM

## 2022-05-21 DIAGNOSIS — E1169 Type 2 diabetes mellitus with other specified complication: Secondary | ICD-10-CM

## 2022-05-21 MED ORDER — TRULICITY 1.5 MG/0.5ML ~~LOC~~ SOAJ: 1.5000 mg | SUBCUTANEOUS | 7 refills | Status: DC

## 2022-05-21 NOTE — Telephone Encounter (Signed)
New medication sent to patient pharmacy  

## 2022-05-21 NOTE — Telephone Encounter (Signed)
Caller & Relationship to patient: Daughter  Call back number: [phone]  Date of last office visit: 09/24/21  Date of next office visit: 06/04/22  Medication(s) to be refilled:  Dulaglutide (TRULICITY) 1.5 NT/7.0YF SOPN   Preferred Pharmacy:   [facility], [place]   PT has one dose available for the week but then will be out. Informed PT we might be able to get partial refill in but could not confirm.

## 2022-05-27 ENCOUNTER — Ambulatory Visit: Payer: 59 | Admitting: Physician Assistant

## 2022-06-04 ENCOUNTER — Ambulatory Visit: Payer: 59 | Admitting: Emergency Medicine

## 2022-06-04 ENCOUNTER — Encounter: Payer: Self-pay | Admitting: Emergency Medicine

## 2022-06-04 VITALS — BP 130/70 | HR 73 | Temp 98.5°F | Ht 68.0 in | Wt 218.4 lb

## 2022-06-04 DIAGNOSIS — F418 Other specified anxiety disorders: Secondary | ICD-10-CM | POA: Diagnosis not present

## 2022-06-04 DIAGNOSIS — E1159 Type 2 diabetes mellitus with other circulatory complications: Secondary | ICD-10-CM | POA: Diagnosis not present

## 2022-06-04 DIAGNOSIS — I152 Hypertension secondary to endocrine disorders: Secondary | ICD-10-CM

## 2022-06-04 LAB — POCT GLYCOSYLATED HEMOGLOBIN (HGB A1C): Hemoglobin A1C: 5.8 % — AB (ref 4.0–5.6)

## 2022-06-04 MED ORDER — ALPRAZOLAM 0.5 MG PO TABS: 0.5000 mg | ORAL_TABLET | Freq: Two times a day (BID) | ORAL | 1 refills | Status: DC | PRN

## 2022-06-04 NOTE — Assessment & Plan Note (Signed)
Fear of flying. Recommend alprazolam 0.5 mg 1 to 2 tablets 1 hour before flying.  Repeat as needed.

## 2022-06-04 NOTE — Progress Notes (Signed)
Tracie Burton 62 y.o.   Chief Complaint  Patient presents with   Follow-up    6 mnth f/u appt, patient is going to be traveling outside of the country needs something for anxiety  Concern about dark spot under her breast     HISTORY OF PRESENT ILLNESS: This is a 63 y.o. female will be traveling to [place] soon and needs something to help with her fear of flying. Also history of diabetes on Trulicity.  Doing well. No other complaints or medical concerns today.  HPI   Prior to Admission medications   Medication Sig Start Date End Date Taking? Authorizing Provider  ALPRAZolam Tracie Burton) 0.5 MG tablet Take 1 tablet (0.5 mg total) by mouth 2 (two) times daily as needed for anxiety. 06/04/22  Yes Tracie Burton, Tracie Bloomer, MD  Dulaglutide (TRULICITY) 1.5 0000000 SOPN Inject 1.5 mg into the skin once a week. 05/21/22  Yes Tracie Burton, Tracie Bloomer, MD  mupirocin ointment (BACTROBAN) 2 % Apply 1 application topically 2 (two) times daily. 05/29/21  Yes Tracie Salvage, FNP  nystatin-triamcinolone [facility] Tracie Burton) cream Apply 1 application topically 2 (two) times daily. 10/17/20  Yes Tracie Salvage, FNP  rosuvastatin (CRESTOR) 10 MG tablet Take 1 tablet (10 mg total) by mouth daily. 09/24/21  Yes Tracie Pollen, MD  valsartan (DIOVAN) 160 MG tablet TAKE 1 TABLET(160 MG) BY MOUTH DAILY 09/24/21  Yes Tracie Pollen, MD    No Known Allergies  Patient Active Problem List   Diagnosis Date Noted   Hypertension associated with diabetes ([place]) 09/24/2021   Mild cognitive impairment 09/24/2021   Screening for cervical cancer 08/01/2017   Constipation 08/01/2017   Headache 07/01/2017   Refugee health examination 07/01/2017    Past Medical History:  Diagnosis Date   Endometrial polyp    Hypertension    Type 2 diabetes mellitus treated with insulin ([place])    followed by pcp    Past Surgical History:  Procedure Laterality Date   [place] History   Socioeconomic History   Marital status: Single    Spouse name: Not on file   Number of children: 4   Years of education: 12   Highest education level: Not on file  Occupational History   Not on file  Tobacco Use   Smoking status: Former   Smokeless tobacco: Never  Scientific laboratory technician Use: Never used  Substance and Sexual Activity   Alcohol use: Never   Drug use: Never   Sexual activity: Not Currently    Comment: 1st intercourse 62 yo-Fewer than 5 partners  Other Topics Concern   Not on file  Social History Narrative   Right handed   Drinks caffeine   One story home   Social Determinants of Health   Financial Resource Strain: Not on file  Food Insecurity: Not on file  Transportation Needs: Not on file  Physical Activity: Not on file  Stress: Not on file  Social Connections: Not on file  Intimate Partner Violence: Not on file    Family History  Problem Relation Age of Onset   Breast cancer Mother 67   Diabetes Father      Review of Systems  Constitutional: Negative.  Negative for chills and fever.  HENT: Negative.  Negative for congestion and sore throat.   Respiratory: Negative.  Negative for cough and shortness of breath.  Cardiovascular: Negative.  Negative for chest pain and palpitations.  Gastrointestinal: Negative.  Negative for abdominal pain, diarrhea, nausea and vomiting.  Skin: Negative.  Negative for rash.  Neurological: Negative.  Negative for dizziness and headaches.  All other systems reviewed and are negative.  Today's Vitals   06/04/22 1516 06/04/22 1554  BP: (!) 148/88 130/70  Pulse: 73   Temp: 98.5 F (36.9 C)   TempSrc: Oral   SpO2: 96%   Weight: 218 lb 6 oz (99.1 kg)   Height: 5' 8"$  (1.727 m)    Body mass index is 33.2 kg/m. Wt Readings from Last 3 Encounters:  06/04/22 218 lb 6 oz (99.1 kg)  11/20/21 221 lb (100.2 kg)  10/01/21 229 lb (103.9 kg)      Physical Exam Vitals reviewed.  Constitutional:      Appearance: Normal appearance.  HENT:     Head: Normocephalic.  Eyes:     Extraocular Movements: Extraocular movements intact.     Conjunctiva/sclera: Conjunctivae normal.  Cardiovascular:     Rate and Rhythm: Normal rate and regular rhythm.     Pulses: Normal pulses.     Heart sounds: Normal heart sounds.  Pulmonary:     Effort: Pulmonary effort is normal.     Breath sounds: Normal breath sounds.  Musculoskeletal:     Cervical back: No tenderness.     Right lower leg: No edema.     Left lower leg: No edema.  Lymphadenopathy:     Cervical: No cervical adenopathy.  Skin:    General: Skin is warm and dry.  Neurological:     General: No focal deficit present.     Mental Status: She is alert and oriented to person, place, and time.  Psychiatric:        Mood and Affect: Mood normal.        Behavior: Behavior normal.     Results for orders placed or performed in visit on 06/04/22 (from the past 24 hour(s))  POCT HgB A1C     Status: Abnormal   Collection Time: 06/04/22  3:59 PM  Result Value Ref Range   Hemoglobin A1C 5.8 (A) 4.0 - 5.6 %   HbA1c POC (<> result, manual entry)     HbA1c, POC (prediabetic range)     HbA1c, POC (controlled diabetic range)      ASSESSMENT & PLAN: A total of 44 minutes was spent with the patient and counseling/coordination of care regarding preparing for this visit, review of most recent office visit notes, review of chronic medical conditions and their management, review of all medications, review of most recent blood work results including interpretation of today's hemoglobin A1c, education on nutrition, cardiovascular risk associated with hypertension and diabetes, treatment of situational anxiety with alprazolam, prognosis, documentation, need for follow-up.  Problem List Items Addressed This Visit       Cardiovascular and Mediastinum   Hypertension associated with diabetes ([place]) -  Primary    Well-controlled hypertension with normal blood pressure readings at home. Continue valsartan 160 mg daily. Well-controlled diabetes with hemoglobin A1c of 5.8 Continue weekly Trulicity 1.5 mg Cardiovascular risks associated with hypertension and diabetes discussed Diet and nutrition discussed. Continue rosuvastatin 10 mg daily. The 10-year ASCVD risk score (Arnett DK, et al., 2019) is: 7.1%   Values used to calculate the score:     Age: 65 years     Sex: Female     Is Non-Hispanic African American: No     Diabetic: Yes  Tobacco smoker: No     Systolic Blood Pressure: [id number] mmHg     Is BP treated: No     HDL Cholesterol: 58.1 mg/dL     Total Cholesterol: 175 mg/dL Follow-up in 6 months.      Relevant Orders   POCT HgB A1C (Completed)     Other   Situational anxiety    Fear of flying. Recommend alprazolam 0.5 mg 1 to 2 tablets 1 hour before flying.  Repeat as needed.      Relevant Medications   ALPRAZolam (XANAX) 0.5 MG tablet   Patient Instructions  Mantenimiento de la salud en [place] Maintenance, Female Adoptar un estilo de vida saludable y recibir atencin preventiva son importantes para promover la salud y Musician. Consulte al mdico sobre: El esquema adecuado para hacerse pruebas y exmenes peridicos. Cosas que puede hacer por su cuenta para prevenir enfermedades y [place] sano. Qu debo saber sobre la dieta, el peso y el ejercicio? Consuma una dieta saludable  Consuma una dieta que incluya muchas verduras, frutas, productos lcteos con bajo contenido de [place] y Advertising account planner. No consuma muchos alimentos ricos en grasas slidas, azcares agregados o sodio. Mantenga un peso saludable El ndice de masa muscular [facility]) se [place] para identificar problemas de Index. Proporciona una estimacin de la grasa corporal basndose en el peso y la altura. Su mdico puede ayudarle a Radiation protection practitioner [place] y a Scientist, forensic o Theatre manager un peso saludable. Haga  ejercicio con regularidad Haga ejercicio con regularidad. Esta es una de las prcticas ms importantes que puede hacer por su salud. La [employer] de los adultos deben seguir estas pautas: Optometrist, al menos, 150 minutos de actividad fsica por semana. El ejercicio debe aumentar la frecuencia cardaca y Nature conservation officer transpirar (ejercicio de intensidad moderada). Hacer ejercicios de fortalecimiento por lo [employer] por semana. Agregue esto a su plan de ejercicio de intensidad moderada. Pase menos tiempo sentada. Incluso la actividad fsica ligera puede ser beneficiosa. Controle sus niveles de colesterol y lpidos en la sangre Comience a realizarse anlisis de lpidos y Research officer, trade union en la sangre a los 110 aos y luego reptalos cada 5 aos. Hgase controlar los niveles de colesterol con mayor frecuencia si: Sus niveles de lpidos y colesterol son altos. Es mayor de 35 aos. Presenta un alto riesgo de padecer enfermedades cardacas. Qu debo saber sobre las pruebas de deteccin del cncer? Segn su historia clnica y sus antecedentes familiares, es posible que deba realizarse pruebas de deteccin del cncer en diferentes edades. Esto puede incluir pruebas de deteccin de lo siguiente: Cncer de mama. Cncer de cuello uterino. Cncer colorrectal. Cncer de piel. Cncer de pulmn. Qu debo saber sobre la enfermedad cardaca, la diabetes y la hipertensin arterial? Presin arterial y enfermedad cardaca La hipertensin arterial causa enfermedades cardacas y [place] el riesgo de accidente cerebrovascular. Es ms probable que esto se manifieste en las personas que tienen lecturas de presin arterial alta o tienen sobrepeso. Hgase controlar la presin arterial: Cada 3 a 5 aos si tiene entre 18 y 31 aos. Todos los aos si es mayor de 40 aos. Diabetes Realcese exmenes de deteccin de la diabetes con regularidad. Este anlisis revisa el nivel de azcar en la sangre en [place]. Hgase las pruebas de  deteccin: Cada tres aos despus de los 42 aos de edad si tiene un peso normal y un bajo riesgo de padecer diabetes. Con ms frecuencia y a partir de [place] edad inferior si tiene sobrepeso o un alto  riesgo de padecer diabetes. Qu debo saber sobre la prevencin de infecciones? Hepatitis B Si tiene un riesgo ms alto de contraer hepatitis B, debe someterse a un examen de deteccin de este virus. Hable con el mdico para averiguar si tiene riesgo de contraer la infeccin por hepatitis B. Hepatitis C Se recomienda el anlisis a: [employer] 1945 y 1965. Todas las personas que tengan un riesgo de haber contrado hepatitis C. Enfermedades de transmisin sexual (ETS) Hgase las pruebas de Programme researcher, broadcasting/film/video de ITS, incluidas la gonorrea y la clamidia, si: Es sexualmente activa y es menor de 1 aos. Es mayor de 32 aos, y Investment banker, operational informa que corre riesgo de tener este tipo de infecciones. La actividad sexual ha cambiado desde que le hicieron la ltima prueba de deteccin y tiene un riesgo mayor de Best boy clamidia o Radio broadcast assistant. Pregntele al mdico si usted tiene riesgo. Pregntele al mdico si usted tiene un alto riesgo de Museum/gallery curator VIH. El mdico tambin puede recomendarle un medicamento recetado para ayudar a evitar la infeccin por el VIH. Si elige tomar medicamentos para prevenir el VIH, primero debe [employer] de deteccin del VIH. Luego debe hacerse anlisis cada 3 meses mientras est tomando los medicamentos. Embarazo Si est por dejar de Librarian, academic (fase premenopusica) y usted puede quedar [place], busque asesoramiento antes de [place]. Tome de 400 a 800 microgramos (mcg) de cido [employer] si [place]. Pida mtodos de control de la natalidad (anticonceptivos) si desea evitar un embarazo no deseado. Osteoporosis y [place] La osteoporosis es una enfermedad en la que los huesos pierden los minerales y la fuerza por el avance de la edad. El  resultado pueden ser fracturas en los [place]. Si tiene 79 aos o ms, o si est en riesgo de sufrir osteoporosis y fracturas, pregunte a su mdico si debe: Hacerse pruebas de deteccin de prdida sea. Tomar un suplemento de calcio o de vitamina D para reducir el riesgo de fracturas. Recibir terapia de reemplazo hormonal (TRH) para tratar los sntomas de la menopausia. Siga estas indicaciones en su casa: Consumo de alcohol No beba alcohol si: Su mdico le indica no hacerlo. Est embarazada, puede estar embarazada o est tratando de [place]. Si bebe alcohol: Limite la cantidad que bebe a lo siguiente: De 0 a 1 bebida por da. Sepa cunta cantidad de alcohol hay en las bebidas que toma. En los [place], una medida equivale a una botella de cerveza de 12 oz (355 ml), un vaso de vino de 5 oz (148 ml) o un vaso de una bebida alcohlica de alta graduacin de 1 oz (44 ml). Estilo de vida No consuma ningn producto que contenga nicotina o tabaco. Estos productos incluyen cigarrillos, tabaco para Higher education careers adviser y aparatos de vapeo, como los Psychologist, sport and exercise. Si necesita ayuda para dejar de consumir estos productos, consulte al mdico. No consuma drogas. No comparta agujas. Solicite ayuda a su mdico si necesita apoyo o informacin para abandonar las drogas. Indicaciones generales Realcese los estudios de rutina de la salud, dentales y de Public librarian. [place]. Infrmele a su mdico si: Se siente deprimida con frecuencia. Alguna vez ha sido vctima de [place] o no se siente seguro en su casa. Resumen Adoptar un estilo de vida saludable y recibir atencin preventiva son importantes para promover la salud y Musician. Siga las instrucciones del mdico acerca de una dieta saludable, el ejercicio y la realizacin de pruebas o exmenes para  detectar enfermedades. Siga las instrucciones del mdico con respecto al control del colesterol y la presin  arterial. Esta informacin no tiene Marine scientist el consejo del mdico. Asegrese de hacerle al mdico cualquier pregunta que tenga. Document Revised: 09/14/2020 Document Reviewed: 09/14/2020 Elsevier Patient Education  [place], MD [facility]

## 2022-06-04 NOTE — Assessment & Plan Note (Signed)
Well-controlled hypertension with normal blood pressure readings at home. Continue valsartan 160 mg daily. Well-controlled diabetes with hemoglobin A1c of 5.8 Continue weekly Trulicity 1.5 mg Cardiovascular risks associated with hypertension and diabetes discussed Diet and nutrition discussed. Continue rosuvastatin 10 mg daily. The 10-year ASCVD risk score (Arnett DK, et al., 2019) is: 7.1%   Values used to calculate the score:     Age: 62 years     Sex: Female     Is Non-Hispanic African American: No     Diabetic: Yes     Tobacco smoker: No     Systolic Blood Pressure: [id number]     Is BP treated: No     HDL Cholesterol: 58.1 mg/dL     Total Cholesterol: 175 mg/dL Follow-up in 6 months.

## 2022-06-04 NOTE — Patient Instructions (Signed)

## 2022-06-18 ENCOUNTER — Telehealth: Payer: Self-pay | Admitting: Emergency Medicine

## 2022-06-18 NOTE — Telephone Encounter (Signed)
Patients daughter called and said that pharmacy does not have the trulicity.  She would like a rx  of ozempic sent to the pharmacy - [facility] on [address]  Patient's number:  [phone]

## 2022-06-19 ENCOUNTER — Other Ambulatory Visit: Payer: Self-pay | Admitting: Emergency Medicine

## 2022-06-19 MED ORDER — SEMAGLUTIDE (1 MG/DOSE) 4 MG/3ML ~~LOC~~ SOPN: 1.0000 mg | PEN_INJECTOR | SUBCUTANEOUS | 5 refills | Status: DC

## 2022-06-19 NOTE — Telephone Encounter (Signed)
New prescription for Ozempic sent to pharmacy of record.  Thanks.

## 2022-06-19 NOTE — Telephone Encounter (Signed)
Called the patient daughter to inform her that a new Rx for the Ozempic was sent to patient requested pharmacy

## 2022-06-25 ENCOUNTER — Telehealth: Payer: Self-pay | Admitting: Emergency Medicine

## 2022-06-25 MED ORDER — SEMAGLUTIDE (1 MG/DOSE) 4 MG/3ML ~~LOC~~ SOPN: 1.0000 mg | PEN_INJECTOR | SUBCUTANEOUS | 5 refills | Status: AC

## 2022-06-25 NOTE — Telephone Encounter (Signed)
Prescription sent to patient requested pharmacy

## 2022-06-25 NOTE — Telephone Encounter (Signed)
Patient called and stated that the pharmacy we have on file ([id number] - [place], [place]), no longer takes her insurance. Patient is requesting her medication Semaglutide, 1 MG/DOSE, 4 MG/3ML SOPN  to [facility] [address].  Patient also stated that this medication requires a Prior Authorization from her insurance.  Best callback number is [phone].

## 2022-06-27 ENCOUNTER — Telehealth: Payer: Self-pay

## 2022-06-27 ENCOUNTER — Other Ambulatory Visit (HOSPITAL_COMMUNITY): Payer: Self-pay

## 2022-06-27 NOTE — Telephone Encounter (Signed)
Pharmacy Patient Advocate Encounter   Received notification that prior authorization for Ozempic is required/requested.  Per Test Claim: Product/service not covered, plan benefit exclusion   PA submitted on 06/27/22 to (ins) [employer] via CoverMyMeds Key  # [id number] Status is pending

## 2022-07-01 NOTE — Telephone Encounter (Signed)
Received a fax regarding Prior Authorization from [facility] [place] for [place]. Authorization has been DENIED because criteria for medical necessity were not met. Your records with [place] and the information submitted by your doctor do not show you meet the following criteria: (1) laboratory documentation supporting a condition of type 2 diabetes mellitus (high blood sugar levels) as evidenced by any of the following: a) fasting glucose (blood sugar) greater than 126 milligram per deciliter (mg/dL), b) two hour glucose tolerance test greater than 200 mg/dL, c) hemoglobin A1c (a blood test that is an indicator of blood sugar levels over the past 3 months) of 6.5 percent or greater, or d) symptoms of hyperglycemia (high blood sugar levels) and a random plasma glucose greater than or equal to 200 mg/dL (2) documentation of one of the following: a) adequate trial and failure for at least 90 days or are unable to use metformin 1500 milligrams daily or b) have an A1c (a blood test that is an indicator of blood sugar levels ov  Phone# [phone]  Denial letter attached to chart  Please be advised we currently do not have a pharmacist to review denials, therefore you will need to process appeals accordingly as needed. Thanks for your support at this time.

## 2022-07-02 NOTE — Telephone Encounter (Signed)
Her diabetes is well-controlled because of Trulicity that she has been taking.  What is covered by her plan?

## 2022-07-04 NOTE — Telephone Encounter (Signed)
Called patient's daughter and informed her that the patient med Ozempic was denied. Patient will call her insurance company and see what alternatives is approved, She will call the office with this information

## 2022-07-05 NOTE — Telephone Encounter (Signed)
Patient called and stated that her insurance will not cover Ozempic but they will cover Metformin. Patient is requesting Metformin to be called into [id number] [place], [address]   Best callback number is [phone]   Patient is also requesting a refill on her medication ALPRAZolam (XANAX) 0.5 MG tablet.

## 2022-07-06 ENCOUNTER — Other Ambulatory Visit: Payer: Self-pay | Admitting: Emergency Medicine

## 2022-07-06 DIAGNOSIS — E1159 Type 2 diabetes mellitus with other circulatory complications: Secondary | ICD-10-CM

## 2022-07-06 MED ORDER — METFORMIN HCL 500 MG PO TABS: 500.0000 mg | ORAL_TABLET | Freq: Two times a day (BID) | ORAL | 3 refills | Status: DC

## 2022-07-06 NOTE — Telephone Encounter (Signed)
New prescription for metformin sent to pharmacy of record On 06/04/2022 I sent a prescription for alprazolam, 30 tablets, 1 refill, to be used only as needed.  Too soon for refills and maybe she is not taking it as recommended.  Should not take it that often.  Thanks.

## 2022-07-08 NOTE — Telephone Encounter (Signed)
Called patient daughter to inform her of provider response 

## 2022-07-22 ENCOUNTER — Other Ambulatory Visit: Payer: Self-pay | Admitting: Emergency Medicine

## 2022-07-22 DIAGNOSIS — E1159 Type 2 diabetes mellitus with other circulatory complications: Secondary | ICD-10-CM

## 2022-07-22 DIAGNOSIS — F418 Other specified anxiety disorders: Secondary | ICD-10-CM

## 2022-07-22 MED ORDER — METFORMIN HCL 500 MG PO TABS: 500.0000 mg | ORAL_TABLET | Freq: Two times a day (BID) | ORAL | 3 refills | Status: DC

## 2022-07-22 MED ORDER — ALPRAZOLAM 0.5 MG PO TABS: 0.5000 mg | ORAL_TABLET | Freq: Two times a day (BID) | ORAL | 1 refills | Status: AC | PRN

## 2022-07-22 NOTE — Telephone Encounter (Signed)
[facility], [facility] sent to [facility]

## 2022-07-22 NOTE — Telephone Encounter (Signed)
Prescription Request  07/22/2022  LOV: 06/04/2022  What is the name of the medication or equipment?  metFORMIN (GLUCOPHAGE) 500 MG tablet   ALPRAZolam (XANAX) 0.5 MG tablet   Have you contacted your pharmacy to request a refill? Yes   Which pharmacy would you like this sent to?   RX was originally sent to [facility] on file but PT will no longer be using that pharmacy as their insurance has changed.  [id number] [address], [place] [address] Phone: [phone] Fax: [phone]    Patient notified that their request is being sent to the clinical staff for review and that they should receive a response within 2 business days.   Please advise at [facility] [phone]

## 2022-08-20 ENCOUNTER — Telehealth: Payer: Self-pay | Admitting: Emergency Medicine

## 2022-08-20 ENCOUNTER — Other Ambulatory Visit: Payer: Self-pay | Admitting: *Deleted

## 2022-08-20 DIAGNOSIS — I152 Hypertension secondary to endocrine disorders: Secondary | ICD-10-CM

## 2022-08-20 MED ORDER — VALSARTAN 160 MG PO TABS: ORAL_TABLET | ORAL | 3 refills | Status: DC

## 2022-08-20 NOTE — Telephone Encounter (Signed)
Prescription Request  08/20/2022  LOV: 06/04/2022  What is the name of the medication or equipment? valsartan (DIOVAN) 160 MG tablet   Have you contacted your pharmacy to request a refill? No   Which pharmacy would you like this sent to?  [id number] [place], [place] - [address] [place] [address] Phone: [phone] Fax: [phone]    Patient notified that their request is being sent to the clinical staff for review and that they should receive a response within 2 business days.   Please advise at [facility] [phone]

## 2022-08-20 NOTE — Telephone Encounter (Signed)
New medication sent to patient requested pharmacy 

## 2022-10-21 ENCOUNTER — Encounter: Payer: Self-pay | Admitting: Emergency Medicine

## 2022-10-21 ENCOUNTER — Ambulatory Visit (INDEPENDENT_AMBULATORY_CARE_PROVIDER_SITE_OTHER): Payer: 59 | Admitting: Emergency Medicine

## 2022-10-21 ENCOUNTER — Ambulatory Visit (INDEPENDENT_AMBULATORY_CARE_PROVIDER_SITE_OTHER): Payer: 59

## 2022-10-21 VITALS — BP 132/76 | HR 73 | Temp 98.4°F | Ht 68.0 in | Wt 223.2 lb

## 2022-10-21 DIAGNOSIS — R29818 Other symptoms and signs involving the nervous system: Secondary | ICD-10-CM

## 2022-10-21 DIAGNOSIS — G8929 Other chronic pain: Secondary | ICD-10-CM

## 2022-10-21 DIAGNOSIS — R52 Pain, unspecified: Secondary | ICD-10-CM

## 2022-10-21 DIAGNOSIS — E1159 Type 2 diabetes mellitus with other circulatory complications: Secondary | ICD-10-CM | POA: Diagnosis not present

## 2022-10-21 DIAGNOSIS — Z7984 Long term (current) use of oral hypoglycemic drugs: Secondary | ICD-10-CM | POA: Diagnosis not present

## 2022-10-21 DIAGNOSIS — M545 Low back pain, unspecified: Secondary | ICD-10-CM

## 2022-10-21 DIAGNOSIS — I152 Hypertension secondary to endocrine disorders: Secondary | ICD-10-CM | POA: Diagnosis not present

## 2022-10-21 LAB — CBC WITH DIFFERENTIAL/PLATELET
Basophils Absolute: 0.1 10*3/uL (ref 0.0–0.1)
Basophils Relative: 1 % (ref 0.0–3.0)
Eosinophils Absolute: 0.3 10*3/uL (ref 0.0–0.7)
Eosinophils Relative: 4.1 % (ref 0.0–5.0)
HCT: 39.5 % (ref 36.0–46.0)
Hemoglobin: 12.8 g/dL (ref 12.0–15.0)
Lymphocytes Relative: 27.3 % (ref 12.0–46.0)
Lymphs Abs: 2.3 10*3/uL (ref 0.7–4.0)
MCHC: 32.5 g/dL (ref 30.0–36.0)
MCV: 86.6 fl (ref 78.0–100.0)
Monocytes Absolute: 0.4 10*3/uL (ref 0.1–1.0)
Monocytes Relative: 4.5 % (ref 3.0–12.0)
Neutro Abs: 5.3 10*3/uL (ref 1.4–7.7)
Neutrophils Relative %: 63.1 % (ref 43.0–77.0)
Platelets: 241 10*3/uL (ref 150.0–400.0)
RBC: 4.56 Mil/uL (ref 3.87–5.11)
RDW: 13.9 % (ref 11.5–15.5)
WBC: 8.4 10*3/uL (ref 4.0–10.5)

## 2022-10-21 LAB — POCT GLYCOSYLATED HEMOGLOBIN (HGB A1C): Hemoglobin A1C: 6.7 % — AB (ref 4.0–5.6)

## 2022-10-21 LAB — LIPID PANEL
Cholesterol: 200 mg/dL (ref 0–200)
HDL: 63.1 mg/dL (ref >39.00)
LDL Cholesterol: 110 mg/dL — ABNORMAL HIGH (ref 0–99)
NonHDL: 137.25
Total CHOL/HDL Ratio: 3
Triglycerides: 136 mg/dL (ref 0.0–149.0)
VLDL: 27.2 mg/dL (ref 0.0–40.0)

## 2022-10-21 LAB — COMPREHENSIVE METABOLIC PANEL
ALT: 14 U/L (ref 0–35)
AST: 17 U/L (ref 0–37)
Albumin: 4.5 g/dL (ref 3.5–5.2)
Alkaline Phosphatase: 97 U/L (ref 39–117)
BUN: 20 mg/dL (ref 6–23)
CO2: 26 mEq/L (ref 19–32)
Calcium: 9.5 mg/dL (ref 8.4–10.5)
Chloride: 106 mEq/L (ref 96–112)
Creatinine, Ser: 0.79 mg/dL (ref 0.40–1.20)
GFR: 80.05 mL/min (ref >60.00)
Glucose, Bld: 89 mg/dL (ref 70–99)
Potassium: 4 mEq/L (ref 3.5–5.1)
Sodium: 140 mEq/L (ref 135–145)
Total Bilirubin: 0.5 mg/dL (ref 0.2–1.2)
Total Protein: 7.6 g/dL (ref 6.0–8.3)

## 2022-10-21 LAB — MICROALBUMIN / CREATININE URINE RATIO
Creatinine,U: 119.1 mg/dL
Microalb Creat Ratio: 1.3 mg/g (ref 0.0–30.0)
Microalb, Ur: 1.5 mg/dL (ref 0.0–1.9)

## 2022-10-21 MED ORDER — ROSUVASTATIN CALCIUM 10 MG PO TABS: 10.0000 mg | ORAL_TABLET | Freq: Every day | ORAL | 3 refills | Status: DC

## 2022-10-21 MED ORDER — ZOLPIDEM TARTRATE 5 MG PO TABS
5.0000 mg | ORAL_TABLET | Freq: Every evening | ORAL | 1 refills | Status: AC | PRN
Start: 2022-10-21 — End: ?

## 2022-10-21 MED ORDER — MELOXICAM 15 MG PO TABS
15.0000 mg | ORAL_TABLET | Freq: Every day | ORAL | 0 refills | Status: AC
Start: 2022-10-21 — End: 2022-11-04

## 2022-10-21 NOTE — Assessment & Plan Note (Signed)
Well-controlled hypertension Continue valsartan 160 mg daily. Well-controlled diabetes with hemoglobin A1c of 6.7 Continue metformin 500 mg twice a day and semaglutide 1 mg weekly Cardiovascular risks associated with hypertension and diabetes discussed Diet and nutrition discussed Follow-up in 6 months

## 2022-10-21 NOTE — Patient Instructions (Signed)
Apnea del sueo Sleep Apnea La apnea del sueo afecta la respiracin mientras se duerme. Hace que la respiracin se detenga durante 10 segundos o ms tiempo, o se vuelva superficial. Las personas con apnea del sueo suelen roncar fuerte. Tambin puede aumentar el riesgo de: Infarto de miocardio. Accidente cerebrovascular. Tener mucho sobrepeso (obesidad). Diabetes. Insuficiencia cardaca. Latidos cardacos irregulares. Presin arterial alta. El objetivo del tratamiento es ayudarle a respirar normalmente otra vez. Cules son las causas?  La causa ms frecuente de esta afeccin es la obstruccin o el colapso de las vas respiratorias. Existen tres tipos de apnea del sueo: Apnea obstructiva del sueo. Esta ocurre cuando las vas respiratorias se obstruyen o colapsan. Apnea central del sueo. Esta ocurre cuando el cerebro no enva las seales correctas a los msculos que controlan la respiracin. Apnea mixta del sueo. Esta es una combinacin de apnea obstructiva y central del sueo. Qu incrementa el riesgo? Tener sobrepeso. Fumar. Tener vas respiratorias pequeas. El envejecimiento. Ser hombre. El consumo de alcohol. Tomar medicamentos para calmarse (sedantes o tranquilizantes). Tener familiares con esta afeccin. Tener la lengua o las amgdalas ms grandes de lo normal. Cules son los signos o sntomas? Dificultad para permanecer dormido. Ronquidos fuertes. Dolor de cabeza por la maana. Despertarse con falta de aliento. Sequedad de boca o dolor de garganta por la maana. Estar somnoliento o cansado durante el da. Si tiene sueo o est cansado durante el da, tambin es posible que: No pueda enfocar la mente (concentrarse). Olvide las cosas. Se enfade mucho y tenga cambios de humor. Se sienta triste (deprimido). Haya cambios en su personalidad. Tenga menos inters en el sexo si es mujer. Sea incapaz de tener una ereccin si es hombre. Cmo se trata?  Dormir de  costado. Usar un medicamento para eliminar la mucosidad de la nariz (descongestivo). Evitar el consumo de alcohol, medicamentos que ayudan a relajarse o ciertos analgsicos (narcticos). Bajar de peso, si es necesario. Cambios en la dieta. Dejar de fumar. Usar una mquina para abrir las vas respiratorias mientras duerme; por ejemplo: Un aparato bucal. Se trata de una boquilla que desplaza la mandbula hacia adelante. Un dispositivo CPAP. Este dispositivo sopla aire a travs de una mscara cuando usted exhala. Un dispositivo EPAP. Este tiene vlvulas que se colocan en cada fosa nasal. Un dispositivo BIPAP. Este dispositivo sopla aire a travs de una mscara cuando usted inhala y exhala. Someterse a ciruga si los dems tratamientos no resultan eficaces. Siga estas indicaciones en su casa: Estilo de vida Haga los cambios que le haya recomendado el mdico. Siga una dieta saludable. Baje de peso, si es necesario. Evite el alcohol, los medicamentos para relajarse y algunos analgsicos. No fume ni consuma ningn producto que contenga nicotina o tabaco. Si necesita ayuda para dejar de consumir estos productos, consulte al mdico. Indicaciones generales Use los medicamentos de venta libre y los recetados solamente como se lo haya indicado el mdico. Si le proporcionaron una mquina para usar mientras duerme, sela solamente como se lo haya indicado el mdico. Si va a someterse a una ciruga, no olvide informarle al mdico que tiene apnea del sueo. Puede ser necesario que lleve su dispositivo consigo. Concurra a todas las visitas de seguimiento. Comunquese con un mdico si: El dispositivo que le proporcionaron para usar mientras duerme le molesta o parece no funcionar. No se siente mejor. Empeora. Solicite ayuda de inmediato si: Le duele el pecho. Tiene dificultad para inhalar suficiente aire. Tiene molestias en la espalda, en los brazos o en   el estmago. Tiene dificultad para  hablar. Siente debilidad en un lado del cuerpo. Se le cae un lado de la cara. Estos sntomas pueden indicar una emergencia. Solicite ayuda de inmediato. Comunquese con el servicio de emergencias de su localidad (911 en los [place]). No espere a ver si los sntomas desaparecen. No conduzca por sus propios medios hasta el hospital. Resumen Esta afeccin afecta la respiracin durante el sueo. La causa ms frecuente es la obstruccin o el colapso de las vas respiratorias. El objetivo del tratamiento es ayudarlo a respirar normalmente mientras duerme. Esta informacin no tiene como fin reemplazar el consejo del mdico. Asegrese de hacerle al mdico cualquier pregunta que tenga. Document Revised: 12/20/2020 Document Reviewed: 12/20/2020 Elsevier Patient Education  2024 Elsevier Inc.  

## 2022-10-21 NOTE — Assessment & Plan Note (Signed)
Mechanical in nature.  No red flag signs or symptoms Pain management discussed Recommend daily meloxicam 15 mg for 2 weeks X-ray done today.  Report reviewed.

## 2022-10-21 NOTE — Assessment & Plan Note (Signed)
Chronic inflammatory condition Lack of sleep contributing a great deal Recommend Ambien 5 mg daily

## 2022-10-21 NOTE — Progress Notes (Signed)
Tracie Burton 62 y.o.   Chief Complaint  Patient presents with   Back Pain    Lower back pain x 1-2 months ago,    HISTORY OF PRESENT ILLNESS: Accompanied by daughter today. This is a 62 y.o. female complaining of almost daily lumbar pain for the past 2 months.  Ibuprofen helps.  No radiation.  No associated symptoms.  Denies trauma. Denies bowel or bladder symptoms. Also complaining of generalized achiness. Does not sleep more than 4 hours per night.  Interrupted sleep.  Wakes up tired with headaches.  Has a daytime somnolence.  Snores loudly per daughter.  Back Pain Pertinent negatives include no abdominal pain, dysuria or fever.     Prior to Admission medications   Medication Sig Start Date End Date Taking? Authorizing Provider  ALPRAZolam Prudy Feeler) 0.5 MG tablet Take 1 tablet (0.5 mg total) by mouth 2 (two) times daily as needed for anxiety. 07/22/22  Yes Levette Paulick, Eilleen Kempf, MD  metFORMIN (GLUCOPHAGE) 500 MG tablet Take 1 tablet (500 mg total) by mouth 2 (two) times daily with a meal. 07/22/22  Yes Maedell Hedger, Eilleen Kempf, MD  mupirocin ointment (BACTROBAN) 2 % Apply 1 application topically 2 (two) times daily. 05/29/21  Yes Olive Bass, FNP  nystatin-triamcinolone [facility]) cream Apply 1 application topically 2 (two) times daily. 10/17/20  Yes Olive Bass, FNP  rosuvastatin (CRESTOR) 10 MG tablet Take 1 tablet (10 mg total) by mouth daily. 09/24/21  Yes Arrion Burruel, Eilleen Kempf, MD  Semaglutide, 1 MG/DOSE, 4 MG/3ML SOPN Inject 1 mg as directed once a week. 06/25/22  Yes Georgina Quint, MD  valsartan (DIOVAN) 160 MG tablet TAKE 1 TABLET(160 MG) BY MOUTH DAILY 08/20/22  Yes Georgina Quint, MD    No Known Allergies  Patient Active Problem List   Diagnosis Date Noted   Situational anxiety 06/04/2022   Hypertension associated with diabetes (HCC) 09/24/2021    Past Medical History:  Diagnosis Date   Endometrial polyp    Hypertension     Type 2 diabetes mellitus treated with insulin (HCC)    followed by pcp    Past Surgical History:  Procedure Laterality Date   APPENDECTOMY  1982   CESAREAN SECTION  1981, 1989, 1994, 1998   CHOLECYSTECTOMY  1989    Social History   Socioeconomic History   Marital status: Single    Spouse name: Not on file   Number of children: 4   Years of education: 12   Highest education level: Not on file  Occupational History   Not on file  Tobacco Use   Smoking status: Former   Smokeless tobacco: Never  Building services engineer Use: Never used  Substance and Sexual Activity   Alcohol use: Never   Drug use: Never   Sexual activity: Not Currently    Comment: 1st intercourse 62 yo-Fewer than 5 partners  Other Topics Concern   Not on file  Social History Narrative   Right handed   Drinks caffeine   One story home   Social Determinants of Health   Financial Resource Strain: Not on file  Food Insecurity: Not on file  Transportation Needs: Not on file  Physical Activity: Not on file  Stress: Not on file  Social Connections: Not on file  Intimate Partner Violence: Not on file    Family History  Problem Relation Age of Onset   Breast cancer Mother 21   Diabetes Father      Review of  Systems  Constitutional: Negative.  Negative for chills and fever.  HENT: Negative.  Negative for congestion and sore throat.   Respiratory: Negative.  Negative for cough and shortness of breath.   Gastrointestinal:  Negative for abdominal pain, diarrhea, nausea and vomiting.  Genitourinary: Negative.  Negative for dysuria and hematuria.  Musculoskeletal:  Positive for back pain.  Skin: Negative.  Negative for rash.  All other systems reviewed and are negative.   Vitals:   10/21/22 1427  Pulse: 73  Temp: 98.4 F (36.9 C)  SpO2: 95%    Physical Exam Vitals reviewed.  Constitutional:      Appearance: Normal appearance.  HENT:     Head: Normocephalic.     Mouth/Throat:     Mouth:  Mucous membranes are moist.     Pharynx: Oropharynx is clear.  Eyes:     Extraocular Movements: Extraocular movements intact.     Conjunctiva/sclera: Conjunctivae normal.     Pupils: Pupils are equal, round, and reactive to light.  Cardiovascular:     Rate and Rhythm: Normal rate and regular rhythm.     Pulses: Normal pulses.     Heart sounds: Normal heart sounds.  Pulmonary:     Effort: Pulmonary effort is normal.     Breath sounds: Normal breath sounds.  Abdominal:     Palpations: Abdomen is soft.     Tenderness: There is no abdominal tenderness.  Skin:    General: Skin is warm and dry.  Neurological:     Mental Status: She is alert and oriented to person, place, and time.  Psychiatric:        Mood and Affect: Mood normal.        Behavior: Behavior normal.    Results for orders placed or performed in visit on 10/21/22 (from the past 24 hour(s))  POCT HgB A1C     Status: Abnormal   Collection Time: 10/21/22  3:34 PM  Result Value Ref Range   Hemoglobin A1C 6.7 (A) 4.0 - 5.6 %   HbA1c POC (<> result, manual entry)     HbA1c, POC (prediabetic range)     HbA1c, POC (controlled diabetic range)     DG Lumbar Spine 2-3 Views  Result Date: 10/21/2022 CLINICAL DATA:  Chronic low back pain. EXAM: LUMBAR SPINE - 2-3 VIEW COMPARISON:  None Available. FINDINGS: No evidence for an acute fracture in the lumbar spine. Loss of disc height noted L4-5 and L5-S1 with degenerative changes are noted around the L4-5 and L5-S1 discs and mild anterolisthesis of L5 on S1, similar to prior. SI joints unremarkable. IMPRESSION: Degenerative changes in the lower lumbar spine without acute bony findings. Electronically Signed   By: Kennith Center M.D.   On: 10/21/2022 15:47     ASSESSMENT & PLAN: A total of 47 minutes was spent with the patient and counseling/coordination of care regarding preparing for this visit, review of most recent office visit notes, review of chronic medical conditions under  management, review of most recent blood work results including interpretation of today's hemoglobin A1c, review of all medications, possibility of obstructive sleep apnea and need for sleep studies, pain management, education on nutrition, prognosis, documentation and need for follow-up.  Problem List Items Addressed This Visit       Cardiovascular and Mediastinum   Hypertension associated with diabetes (HCC) - Primary    Well-controlled hypertension Continue valsartan 160 mg daily. Well-controlled diabetes with hemoglobin A1c of 6.7 Continue metformin 500 mg twice a day and  semaglutide 1 mg weekly Cardiovascular risks associated with hypertension and diabetes discussed Diet and nutrition discussed Follow-up in 6 months      Relevant Medications   rosuvastatin (CRESTOR) 10 MG tablet   Other Relevant Orders   POCT HgB A1C   Urine Microalbumin w/creat. ratio   CBC with Differential/Platelet   Comprehensive metabolic panel   Lipid panel     Other   Suspected sleep apnea    Loud snoring, interrupted poor quality sleep, wakes up tired, daytime somnolence, and generalized achiness. Most likely related to sleep apnea Needs sleep studies Referral placed today      Relevant Medications   zolpidem (AMBIEN) 5 MG tablet   Other Relevant Orders   Ambulatory referral to Sleep Studies   Chronic bilateral low back pain without sciatica    Mechanical in nature.  No red flag signs or symptoms Pain management discussed Recommend daily meloxicam 15 mg for 2 weeks X-ray done today.  Report reviewed.      Relevant Medications   meloxicam (MOBIC) 15 MG tablet   Other Relevant Orders   DG Lumbar Spine 2-3 Views   Generalized body aches    Chronic inflammatory condition Lack of sleep contributing a great deal Recommend Ambien 5 mg daily      Relevant Medications   meloxicam (MOBIC) 15 MG tablet   Patient Instructions  Apnea del sueo Sleep Apnea La apnea del sueo afecta la  respiracin mientras se duerme. Hace que la respiracin se detenga durante 10 segundos o ms tiempo, o se vuelva superficial. Las personas con apnea del sueo suelen Secretary/administrator. Tambin puede aumentar el riesgo de: Infarto de miocardio. Accidente cerebrovascular. Tener mucho sobrepeso (obesidad). Diabetes. Insuficiencia cardaca. Latidos cardacos irregulares. Presin arterial alta. [place] del tratamiento es ayudarle a respirar normalmente otra vez. Cules son las causas?  La causa ms frecuente de esta afeccin es la obstruccin o el colapso de las vas respiratorias. Existen tres tipos de apnea del sueo: Apnea obstructiva del sueo. Esta ocurre cuando las vas respiratorias se obstruyen o colapsan. Apnea central del sueo. Esta ocurre cuando el cerebro no enva las seales correctas a los msculos que controlan la respiracin. Apnea mixta del sueo. Esta es una combinacin de apnea obstructiva y central del sueo. Qu incrementa el riesgo? Tener sobrepeso. Fumar. Tener vas respiratorias pequeas. El envejecimiento. Ser hombre. El consumo de alcohol. Tomar medicamentos para calmarse (sedantes o tranquilizantes). Tener familiares con esta afeccin. Tener la lengua o las amgdalas ms grandes de lo normal. Cules son los signos o sntomas? Dificultad para permanecer dormido. Ronquidos fuertes. Dolor de cabeza por la maana. Despertarse con falta de aliento. Sequedad de boca o dolor de garganta por la maana. Estar somnoliento o cansado Administrator. Si tiene sueo o est cansado [employer], tambin es posible que: No pueda enfocar la mente (concentrarse). Olvide las cosas. Se enfade mucho y tenga cambios de humor. Se sienta triste (deprimido). Haya cambios en su personalidad. Tenga menos inters en el sexo si es [place]. Sea incapaz de tener una ereccin si es hombre. Cmo se trata?  Dormir de Mudlogger. Usar un medicamento para eliminar la mucosidad de la  nariz (descongestivo). Evitar el consumo de alcohol, medicamentos que ayudan a relajarse o ciertos analgsicos (narcticos). Bajar de [place], si es necesario. Cambios en la dieta. Dejar de fumar. Usar una mquina para abrir las vas respiratorias mientras duerme; por ejemplo: Un aparato bucal. Se trata de una boquilla que desplaza la [place]  hacia adelante. Un dispositivo CPAP. Este dispositivo sopla aire a travs de una mscara cuando usted exhala. Un dispositivo EPAP. Este tiene vlvulas que se colocan en cada fosa nasal. Un dispositivo BIPAP. Este dispositivo sopla aire a travs de una mscara cuando usted inhala y exhala. Someterse a Biomedical engineer tratamientos no Comptroller. Siga estas indicaciones en su casa: Estilo de [employer] cambios que le haya recomendado el mdico. Siga una dieta saludable. Baje de peso, si es necesario. Evite el alcohol, los medicamentos para relajarse y Scientific laboratory technician. No fume ni consuma ningn producto que contenga nicotina o tabaco. Si necesita ayuda para dejar de consumir estos productos, consulte al mdico. Indicaciones generales Use los medicamentos de venta libre y los recetados solamente como se lo haya indicado el mdico. Si le proporcionaron una mquina para usar mientras duerme, sela solamente como se lo haya indicado el mdico. Si va a someterse a una ciruga, no olvide informarle al mdico que tiene apnea del sueo. Puede ser necesario que lleve su dispositivo consigo. Concurra a todas las visitas de seguimiento. Comunquese con un mdico si: El [place] para usar mientras duerme le [place] o parece no funcionar. No se siente mejor. Empeora. Solicite ayuda de inmediato si: Le duele el pecho. Tiene dificultad para inhalar suficiente aire. Tiene molestias en la espalda, en los brazos o en el [place]. Tiene dificultad para hablar. Siente debilidad en un lado del cuerpo. Se le cae un lado de la  cara. Estos sntomas pueden Customer service manager. Solicite ayuda de inmediato. Comunquese con el servicio de emergencias de su localidad (911 en los [place]). No espere a ver si los sntomas desaparecen. No conduzca por sus propios medios [employer] hospital. Resumen Esta afeccin afecta la respiracin durante el sueo. La causa ms frecuente es la obstruccin o el colapso de las vas respiratorias. [place] del tratamiento es ayudarlo a respirar normalmente mientras duerme. Esta informacin no tiene Theme park manager el consejo del mdico. Asegrese de hacerle al mdico cualquier pregunta que tenga. Document Revised: 12/20/2020 Document Reviewed: 12/20/2020 Elsevier Patient Education  2024 Elsevier Inc.      Edwina Barth, MD [facility]

## 2022-10-21 NOTE — Assessment & Plan Note (Addendum)
Loud snoring, interrupted poor quality sleep, wakes up tired, daytime somnolence, and generalized achiness. Most likely related to sleep apnea Needs sleep studies Referral placed today

## 2022-10-25 ENCOUNTER — Telehealth: Payer: Self-pay

## 2022-10-25 NOTE — Telephone Encounter (Signed)
*  Primary  PA request received for Rosuvastatin Calcium 10MG  tablets  PA submitted to [employer] via CMM and is pending additional questions/determination  Key: [id number]

## 2022-11-05 NOTE — Telephone Encounter (Signed)
Pharmacy Patient Advocate Encounter  Received notification from  [facility]  that Prior Authorization for ROSUVASTATIN has been DENIED because PATIENT HAS NOT TRIED AND FAILED ATORVASTATIN 40MG  TO 80MG .Marland Kitchen  PA #/Case ID/Reference #: [id number]  Please be advised we currently do not have a Pharmacist to review denials, therefore you will need to process appeals accordingly as needed. Thanks for your support at this time. Contact for appeals are as follows: Phone: [phone], Fax: [phone]

## 2022-11-10 IMAGING — MG MM DIGITAL SCREENING BILAT W/ TOMO AND CAD
8 series · 8 of 24 positions shown · non-contrast
Comparison: Previous exam(s).

CLINICAL DATA: Screening.

EXAM:
DIGITAL SCREENING BILATERAL MAMMOGRAM WITH TOMOSYNTHESIS AND CAD
TECHNIQUE: Bilateral screening digital craniocaudal and mediolateral oblique
mammograms were obtained. Bilateral screening digital breast
tomosynthesis was performed. The images were evaluated with
computer-aided detection.

[R MLO synth-2D]
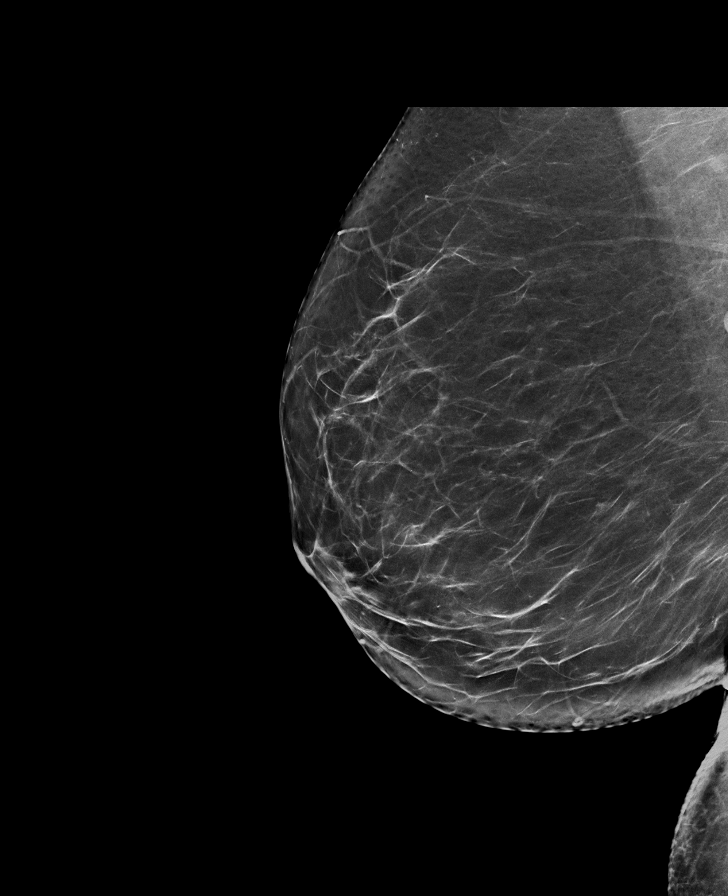

[R CC synth-2D]
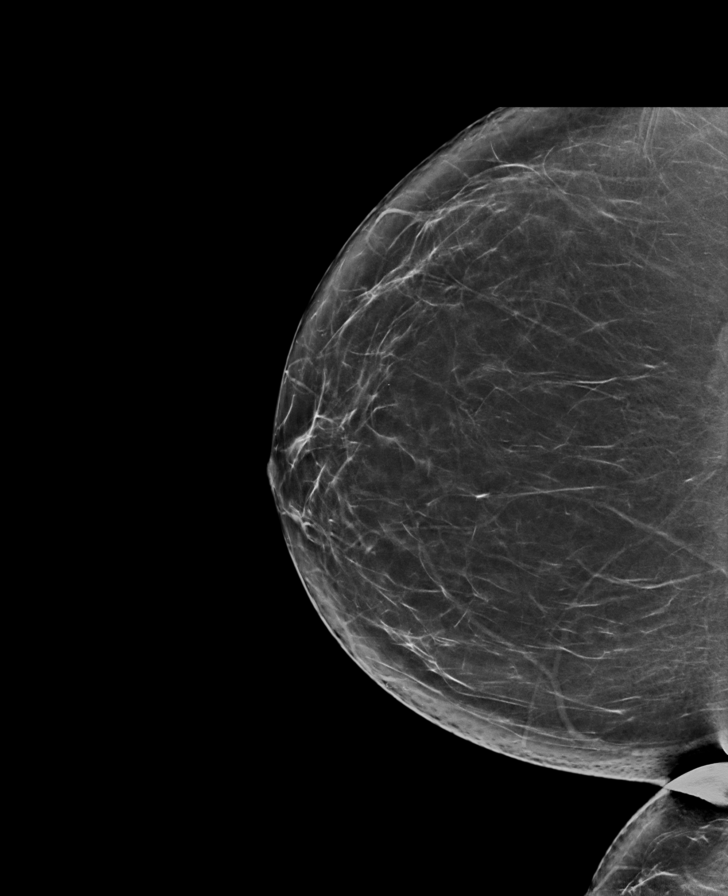

[L MLO synth-2D]
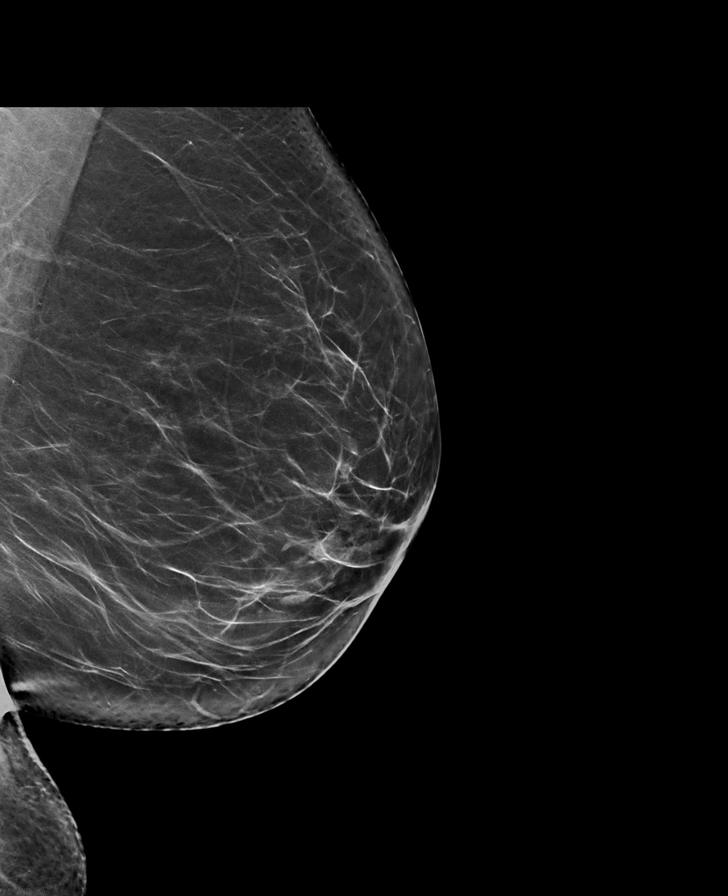

[L CC synth-2D]
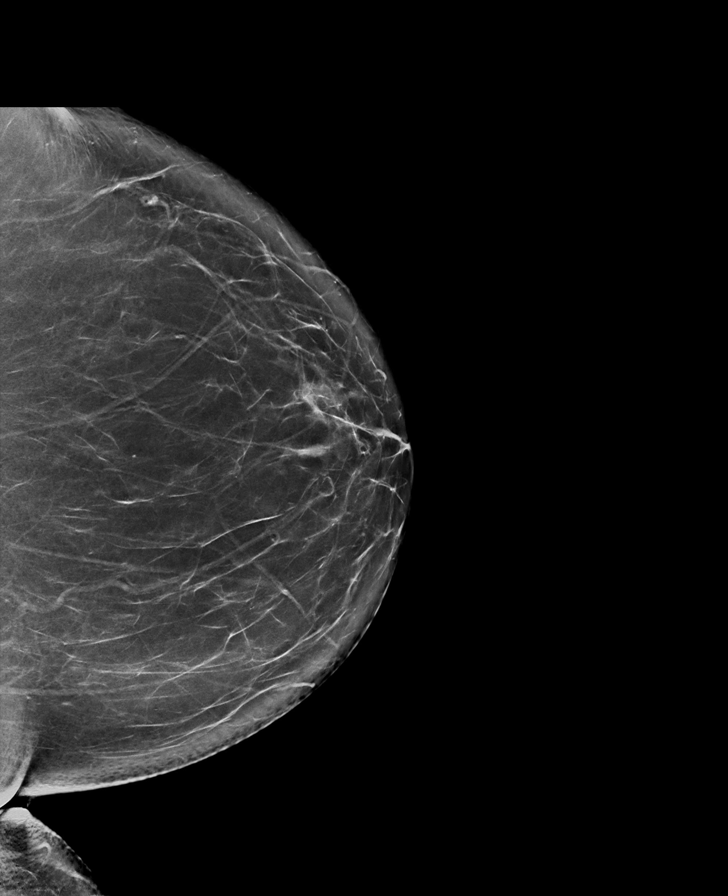

[R CC tomo · tomo slice 41/80.0]
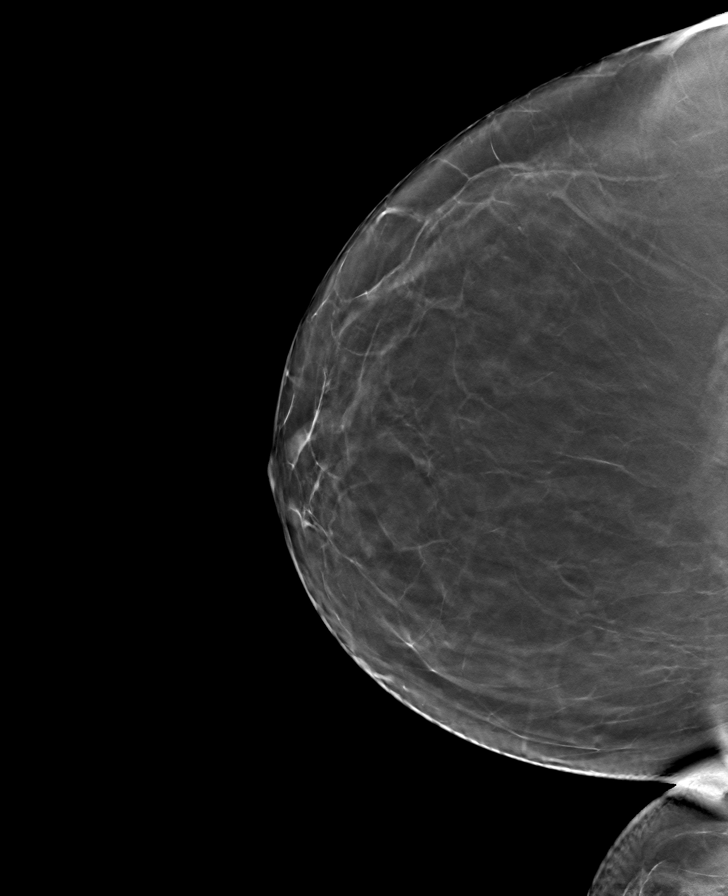

[L CC tomo · tomo slice 43/86.0]
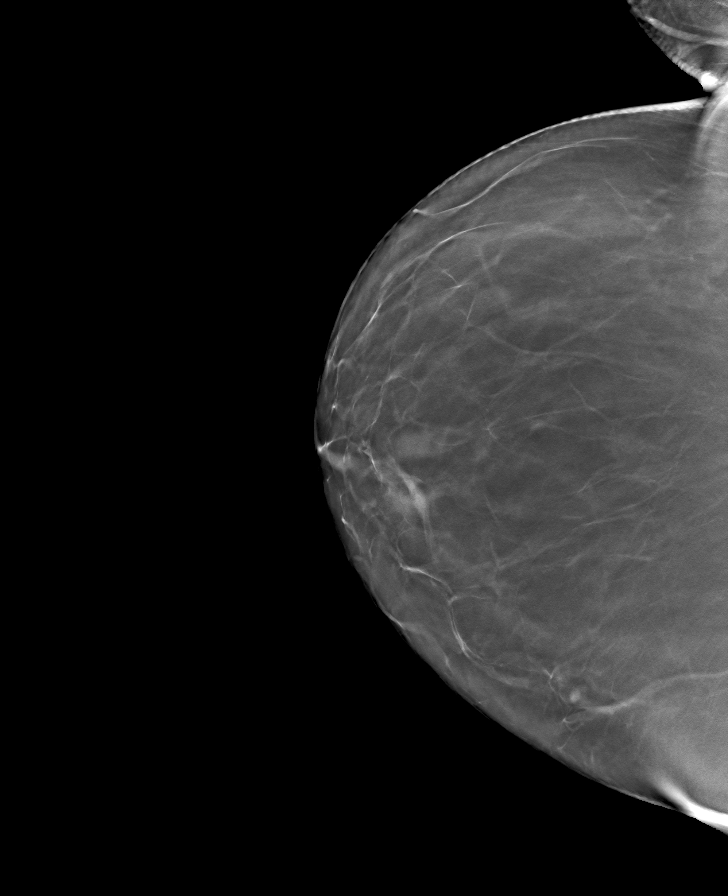

[R MLO tomo · tomo slice 41/81.0]
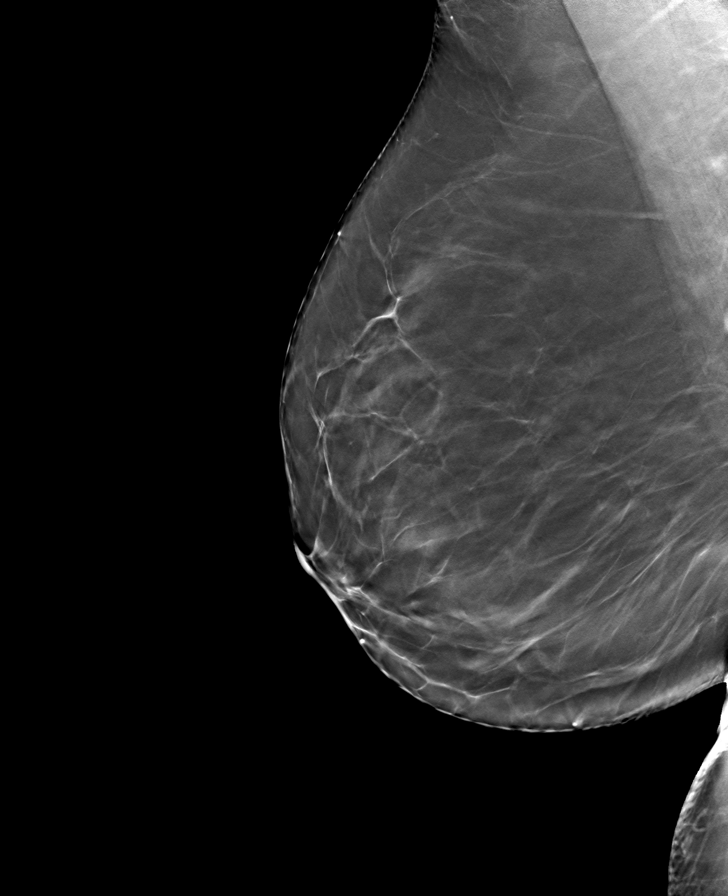

[L MLO tomo · tomo slice 41/81.0]
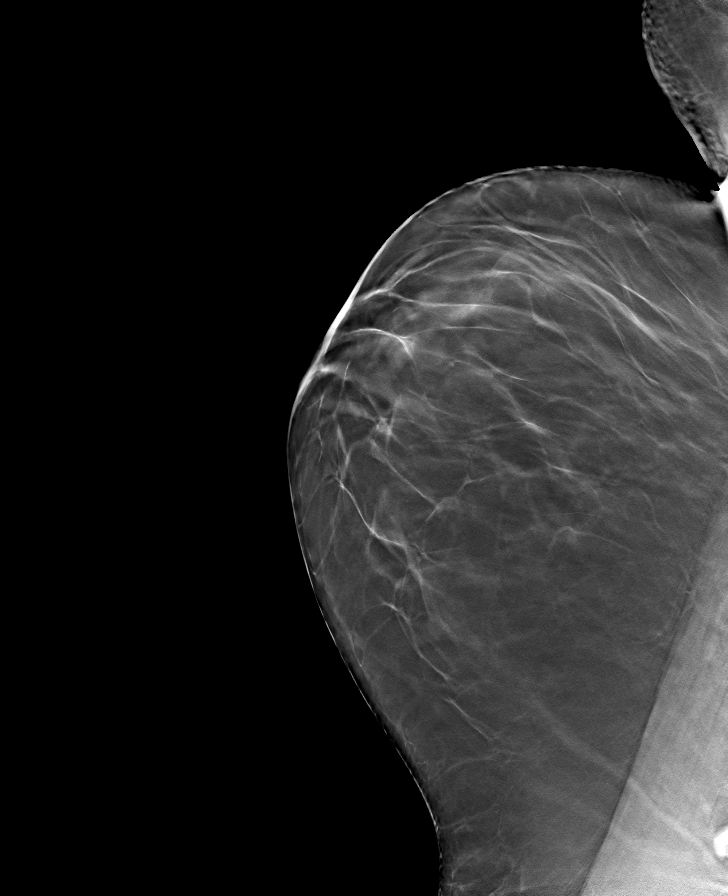

[8 of 24 positions shown; findings below may reference images not displayed]

ACR Breast Density Category b: There are scattered areas of
fibroglandular density.
FINDINGS: There are no findings suspicious for malignancy.
IMPRESSION: No mammographic evidence of malignancy. A result letter of this
screening mammogram will be mailed directly to the patient.

RECOMMENDATION:
Screening mammogram in one year. (Code:51-O-LD2)

BI-RADS CATEGORY  1: Negative.

## 2022-12-10 ENCOUNTER — Ambulatory Visit: Payer: 59 | Admitting: Neurology

## 2022-12-10 ENCOUNTER — Encounter: Payer: Self-pay | Admitting: Neurology

## 2022-12-10 VITALS — BP 117/81 | HR 69 | Ht 66.0 in | Wt 221.0 lb

## 2022-12-10 DIAGNOSIS — E669 Obesity, unspecified: Secondary | ICD-10-CM | POA: Diagnosis not present

## 2022-12-10 DIAGNOSIS — R519 Headache, unspecified: Secondary | ICD-10-CM

## 2022-12-10 DIAGNOSIS — G4719 Other hypersomnia: Secondary | ICD-10-CM | POA: Diagnosis not present

## 2022-12-10 DIAGNOSIS — Z820 Family history of epilepsy and other diseases of the nervous system: Secondary | ICD-10-CM

## 2022-12-10 DIAGNOSIS — R351 Nocturia: Secondary | ICD-10-CM

## 2022-12-10 DIAGNOSIS — R0683 Snoring: Secondary | ICD-10-CM

## 2022-12-10 DIAGNOSIS — R635 Abnormal weight gain: Secondary | ICD-10-CM

## 2022-12-10 NOTE — Patient Instructions (Signed)

## 2022-12-10 NOTE — Progress Notes (Signed)
Subjective:    Patient ID: Tracie Burton is a 62 y.o. female.  HPI    Tracie Foley, MD, PhD [facility] [address] [address] [place], [place] [address]  Dear Dr. Alvy Bimler,  I saw your patient, Tracie Burton, upon your kind request in my sleep clinic today for initial consultation of her sleep disorder, in particular, concern for underlying obstructive sleep apnea.  The patient is accompanied by her daughter and a [place] interpreter, Tracie Burton, today.  As you know, Tracie Burton is a 62 year old female with an underlying medical history of hypertension, diabetes, chronic low back pain, hyperlipidemia, anxiety, and obesity, who reports snoring and sleep disruption as well as daytime somnolence and waking up with headaches.  Her Epworth sleepiness score is 14 out of 24, fatigue severity score is 45 out of 63.  I reviewed your office note from 10/21/2022.  He has had significant weight gain in the realm of 40 pounds in the past 5 years.  She has to get up early for work, she works at [employer], has to be up around 4:45 AM but does not sleep well through the night.  Tries to be in bed by 10 PM.  She lives with her daughter, she is widowed, no pets at the house.  She drinks caffeine in the form of coffee, usually 1 in the morning and 1 with lunch, no alcohol currently, quit smoking some 7 or 8 years ago.  Used to smoke for about 40 years altogether.  She has occasional morning headaches, sometimes takes Tylenol for these.  She has nocturia about twice per average night.  She endorses a family history of sleep apnea, her older sister has a CPAP machine.  She does not currently drive.  Her daughter drives her to work and picks her up.  Her Past Medical History Is Significant For: Past Medical History:  Diagnosis Date   Endometrial polyp    Hypertension    Type 2 diabetes mellitus treated with insulin (HCC)    followed by pcp    Her Past  Surgical History Is Significant For: Past Surgical History:  Procedure Laterality Date   APPENDECTOMY  16   CESAREAN SECTION  1981, 1989, 1994, 1998   CHOLECYSTECTOMY  1989    Her Family History Is Significant For: Family History  Problem Relation Age of Onset   Breast cancer Mother 5   Diabetes Father    Sleep apnea Sister     Her Social History Is Significant For: Social History   Socioeconomic History   Marital status: Single    Spouse name: Not on file   Number of children: 4   Years of education: 12   Highest education level: Not on file  Occupational History   Not on file  Tobacco Use   Smoking status: Former   Smokeless tobacco: Never  Vaping Use   Vaping status: Never Used  Substance and Sexual Activity   Alcohol use: Never   Drug use: Never   Sexual activity: Not Currently    Comment: 1st intercourse 62 yo-Fewer than 5 partners  Other Topics Concern   Not on file  Social History Narrative   Right handed   Drinks caffeine   One story home   Social Determinants of Health   Financial Resource Strain: Not on file  Food Insecurity: Not on file  Transportation Needs: Not on file  Physical Activity: Not on file  Stress: Not on file  Social Connections: Not on file    Her Allergies Are:  No Known Allergies:   Her Current Medications Are:  Outpatient Encounter Medications as of 12/10/2022  Medication Sig   ALPRAZolam (XANAX) 0.5 MG tablet Take 1 tablet (0.5 mg total) by mouth 2 (two) times daily as needed for anxiety.   metFORMIN (GLUCOPHAGE) 500 MG tablet Take 1 tablet (500 mg total) by mouth 2 (two) times daily with a meal.   mupirocin ointment (BACTROBAN) 2 % Apply 1 application topically 2 (two) times daily.   nystatin-triamcinolone (MYCOLOG II) cream Apply 1 application topically 2 (two) times daily.   Semaglutide, 1 MG/DOSE, 4 MG/3ML SOPN Inject 1 mg as directed once a week.   valsartan (DIOVAN) 160 MG tablet TAKE 1 TABLET(160 MG) BY MOUTH  DAILY   zolpidem (AMBIEN) 5 MG tablet Take 1 tablet (5 mg total) by mouth at bedtime as needed for sleep.   rosuvastatin (CRESTOR) 10 MG tablet Take 1 tablet (10 mg total) by mouth daily. (Patient not taking: Reported on 12/10/2022)   No facility-administered encounter medications on file as of 12/10/2022.  :   Review of Systems:  Out of a complete 14 point review of systems, all are reviewed and negative with the exception of these symptoms as listed below:   Review of Systems  Neurological:        Pt here for sleep consult Pt snores,fatigue,headaches,hypertension Pt denies sleep study,sleep consult    ESS:14 FSS:45    Objective:  Neurological Exam  Physical Exam Physical Examination:   Vitals:   12/10/22 1520  BP: 117/81  Pulse: 69    General Examination: The patient is a very pleasant 62 y.o. female in no acute distress. She appears well-developed and well-nourished and well groomed.   HEENT: Normocephalic, atraumatic, pupils are equal, round and reactive to light, extraocular tracking is good without limitation to gaze excursion or nystagmus noted. Hearing is grossly intact. Face is symmetric with normal facial animation. Speech is clear with no dysarthria noted. There is no hypophonia. There is no lip, neck/head, jaw or voice tremor. Neck is supple with full range of passive and active motion. There are no carotid bruits on auscultation. Oropharynx exam reveals: mild mouth dryness, adequate dental hygiene with dentures on top, moderate airway crowding secondary to small airway entry, tonsils on the smaller side, Mallampati class III.  Neck circumference 14-1/4 inches, tongue protrudes centrally and palate elevates symmetrically.    Chest: Clear to auscultation without wheezing, rhonchi or crackles noted.  Heart: S1+S2+0, regular and normal without murmurs, rubs or gallops noted.   Abdomen: Soft, non-tender and non-distended.  Extremities: There is no obvious edema in the  distal lower extremities bilaterally.   Skin: Warm and dry without trophic changes noted.   Musculoskeletal: exam reveals no obvious joint deformities.   Neurologically:  Mental status: The patient is awake, alert and oriented in all 4 spheres. Her immediate and remote memory, attention, language skills and fund of knowledge are appropriate. There is no evidence of aphasia, agnosia, apraxia or anomia. Speech is clear with normal prosody and enunciation. Thought process is linear. Mood is normal and affect is normal.  Cranial nerves II - XII are as described above under HEENT exam.  Motor exam: Normal bulk, strength and tone is noted. There is no obvious action or resting tremor.  Fine motor skills and coordination: grossly intact.  Cerebellar testing: No dysmetria or intention tremor. There is no truncal or gait ataxia.  Sensory exam:  intact to light touch in the upper and lower extremities.  Gait, station and balance: She stands easily. No veering to one side is noted. No leaning to one side is noted. Posture is age-appropriate and stance is narrow based. Gait shows normal stride length and normal pace. No problems turning are noted.   Assessment and Plan:  In summary, Tracie Burton is a very pleasant 62 y.o.-year old female with an underlying medical history of hypertension, diabetes, chronic low back pain, hyperlipidemia, anxiety, and obesity, whose history and physical exam are concerning for sleep disordered breathing, particularly obstructive sleep apnea (OSA).  While a laboratory attended sleep study is typically considered "gold standard" for evaluation of sleep disordered breathing, we mutually agreed to proceed with a home sleep test at this time, she would prefer to do the test at home.   I had a long chat with the patient and her daughter, Tracie Burton, about my findings and the diagnosis of sleep apnea, particularly OSA, its prognosis and treatment options. We talked about  medical/conservative treatments, surgical interventions and non-pharmacological approaches for symptom control. I explained, in particular, the risks and ramifications of untreated moderate to severe OSA, especially with respect to developing cardiovascular disease down the road, including congestive heart failure (CHF), difficult to treat hypertension, cardiac arrhythmias (particularly A-fib), neurovascular complications including TIA, stroke and dementia. Even type 2 diabetes has, in part, been linked to untreated OSA. Symptoms of untreated OSA may include (but may not be limited to) daytime sleepiness, nocturia (i.e. frequent nighttime urination), memory problems, mood irritability and suboptimally controlled or worsening mood disorder such as depression and/or anxiety, lack of energy, lack of motivation, physical discomfort, as well as recurrent headaches, especially morning or nocturnal headaches. We talked about the importance of maintaining a healthy lifestyle and striving for healthy weight.   I recommended a sleep study at this time. I outlined the differences between a laboratory attended sleep study which is considered more comprehensive and accurate over the option of a home sleep test (HST); the latter may lead to underestimation of sleep disordered breathing in some instances and does not help with diagnosing upper airway resistance syndrome and is not accurate enough to diagnose primary central sleep apnea typically. I outlined possible surgical and non-surgical treatment options of OSA, including the use of a positive airway pressure (PAP) device (i.e. CPAP, AutoPAP/APAP or BiPAP in certain circumstances), a custom-made dental device (aka oral appliance, which would require a referral to a specialist dentist or orthodontist typically, and is generally speaking not considered for patients with full dentures or edentulous state), upper airway surgical options, such as traditional UPPP (which is not  considered a first-line treatment) or the Inspire device (hypoglossal nerve stimulator, which would involve a referral for consultation with an ENT surgeon, after careful selection, following inclusion criteria - also not first-line treatment). I explained the PAP treatment option to the patient in detail, as this is generally considered first-line treatment.  The patient indicated that she would be willing to try PAP therapy, if the need arises. I explained the importance of being compliant with PAP treatment, not only for insurance purposes but primarily to improve patient's symptoms symptoms, and for the patient's long term health benefit, including to reduce Her cardiovascular risks longer-term.    We will pick up our discussion about the next steps and treatment options after testing.  We will keep them posted as to the test results by phone call and/or MyChart messaging where possible.  We  will plan to follow-up in sleep clinic accordingly as well.  I answered all their questions today and the patient and her daughter were in agreement.   I encouraged them to call with any interim questions, concerns, problems or updates or email [place] through MyChart.  Generally speaking, sleep test authorizations may take up to 2 weeks, sometimes less, sometimes longer, the patient is encouraged to get in touch with [place] if they do not hear back from the sleep lab staff directly within the next 2 weeks.  Thank you very much for allowing me to participate in the care of this nice patient. If I can be of any further assistance to you please do not hesitate to call me at [phone].  Sincerely,   Tracie Foley, MD, PhD

## 2022-12-14 ENCOUNTER — Other Ambulatory Visit: Payer: Self-pay | Admitting: Emergency Medicine

## 2022-12-14 DIAGNOSIS — I152 Hypertension secondary to endocrine disorders: Secondary | ICD-10-CM

## 2022-12-17 ENCOUNTER — Telehealth: Payer: Self-pay | Admitting: *Deleted

## 2022-12-17 NOTE — Telephone Encounter (Signed)
Received fax from pharmacy Rosuvastatin is not covered by patient insurance. Alternatives are Atorvastatin Simvastatin and Pravastatin

## 2022-12-17 NOTE — Telephone Encounter (Signed)
Okay to use atorvastatin 20 mg daily

## 2022-12-18 MED ORDER — ATORVASTATIN CALCIUM 20 MG PO TABS: 20.0000 mg | ORAL_TABLET | Freq: Every day | ORAL | 3 refills | Status: AC

## 2022-12-18 NOTE — Telephone Encounter (Signed)
New prescription was sent to patient pharmacy

## 2022-12-24 ENCOUNTER — Ambulatory Visit: Payer: 59 | Admitting: Neurology

## 2022-12-24 DIAGNOSIS — G4719 Other hypersomnia: Secondary | ICD-10-CM

## 2022-12-24 DIAGNOSIS — E669 Obesity, unspecified: Secondary | ICD-10-CM

## 2022-12-24 DIAGNOSIS — Z820 Family history of epilepsy and other diseases of the nervous system: Secondary | ICD-10-CM

## 2022-12-24 DIAGNOSIS — R0683 Snoring: Secondary | ICD-10-CM

## 2022-12-24 DIAGNOSIS — G4733 Obstructive sleep apnea (adult) (pediatric): Secondary | ICD-10-CM

## 2022-12-24 DIAGNOSIS — R635 Abnormal weight gain: Secondary | ICD-10-CM

## 2022-12-24 DIAGNOSIS — R351 Nocturia: Secondary | ICD-10-CM

## 2022-12-24 DIAGNOSIS — R519 Headache, unspecified: Secondary | ICD-10-CM

## 2022-12-26 NOTE — Procedures (Signed)
   [facility]  HOME SLEEP TEST (Watch PAT) REPORT  STUDY DATE: 12/24/2022  DOB: 05-19-1960  MRN: [id number]  ORDERING CLINICIAN: Huston Foley, MD, PhD   REFERRING CLINICIAN: Georgina Quint, MD   CLINICAL INFORMATION/HISTORY: 62 year old female with an underlying medical history of hypertension, diabetes, chronic low back pain, hyperlipidemia, anxiety, and obesity, who reports snoring and sleep disruption as well as daytime somnolence and waking up with headaches.   Epworth sleepiness score: 14/24.  BMI: 35.4 kg/m  FINDINGS:   Sleep Summary:   Total Recording Time (hours, min): 7 hours, 35 min  Total Sleep Time (hours, min):  6 hours, 0 min  Percent REM (%):    15.5%   Respiratory Indices:   Calculated pAHI (per hour):  24.9/hour         REM pAHI:    37.2/hour       NREM pAHI: 22.6/hour  Central pAHI: 1.2/hour  Oxygen Saturation Statistics:    Oxygen Saturation (%) Mean: 94%   Minimum oxygen saturation (%):                 72%   O2 Saturation Range (%): 72-99%    O2 Saturation (minutes) <=88%: 1.9 min  Pulse Rate Statistics:   Pulse Mean (bpm):    53/min    Pulse Range (42-90/min)   IMPRESSION: OSA (obstructive sleep apnea), moderate  RECOMMENDATION:  This home sleep test demonstrates moderate obstructive sleep apnea with a total AHI of 24.9/hour and O2 nadir of 72%.  Intermittent mild to moderate snoring was detected. Treatment with a positive airway pressure (PAP) device is recommended. The patient will be advised to proceed with an autoPAP titration/trial at home for now. A full night titration study may be considered to optimize treatment settings, monitor proper oxygen saturations and aid with improvement of tolerance and adherence, if needed down the road. Alternative treatment options may include a dental device through dentistry or orthodontics in selected patients or Inspire (hypoglossal nerve stimulator) in carefully  selected patients (meeting inclusion criteria).  Concomitant weight loss is recommended (where clinically appropriate). Please note that untreated obstructive sleep apnea may carry additional perioperative morbidity. Patients with significant obstructive sleep apnea should receive perioperative PAP therapy and the surgeons and particularly the anesthesiologist should be informed of the diagnosis and the severity of the sleep disordered breathing. The patient should be cautioned not to drive, work at heights, or operate dangerous or heavy equipment when tired or sleepy. Review and reiteration of good sleep hygiene measures should be pursued with any patient. Other causes of the patient's symptoms, including circadian rhythm disturbances, an underlying mood disorder, medication effect and/or an underlying medical problem cannot be ruled out based on this test. Clinical correlation is recommended.  The patient and her referring provider will be notified of the test results. The patient will be seen in follow up in sleep clinic at [facility].  I certify that I have reviewed the raw data recording prior to the issuance of this report in accordance with the standards of the American Academy of Sleep Medicine (AASM).  INTERPRETING PHYSICIAN:   Huston Foley, MD, PhD Medical Director, [facility]) Diplomat, ABPN (Neurology and Sleep)   [facility] [address], [address] [place], [place] [address] [phone]

## 2022-12-26 NOTE — Addendum Note (Signed)
Addended by: Huston Foley on: 12/26/2022 05:02 PM   Modules accepted: Orders

## 2022-12-30 ENCOUNTER — Telehealth: Payer: Self-pay

## 2022-12-30 NOTE — Telephone Encounter (Signed)
Contacted pt with assistance from [employer]. She was informed that [phone] was not Victorino Dike, the daughter of Sherridan Troxell. I attempted to call again with a different interpreter, Onalee Hua, and the call would not go through for him. Will try again later.

## 2022-12-30 NOTE — Telephone Encounter (Signed)
-----   Message from Huston Foley sent at 12/26/2022  5:02 PM EDT ----- Interpreter needed. Patient referred by PCP, seen by me on 12/10/2022, patient had a HST on 12/24/2022.    Please call and notify the patient that the recent home sleep test showed obstructive sleep apnea in the moderate range. I recommend treatment in the form of autoPAP, which means, that we don't have to bring her in for a sleep study with CPAP, but will let her start using a so called autoPAP machine at home, which is a CPAP-like machine with self-adjusting pressures. We will send the order to a local DME company (of her choice, or as per insurance requirement). The DME representative will fit her with a mask, educate her on how to use the machine, how to put the mask on, etc. I have placed an order in the chart. Please send the order, talk to patient, send report to referring MD. We will need a FU in sleep clinic for 10 weeks post-PAP set up, please arrange that with me or one of our NPs. Also reinforce the need for compliance with treatment. Thanks,   Huston Foley, MD, PhD [facility])

## 2023-01-02 NOTE — Telephone Encounter (Addendum)
I used [employer] Tracie Burton [phone] to call the patient's daughter Tracie Burton (?) (on [place]).  I confirmed the patient's name and date of birth with daughter.  We discussed the patient's sleep study results.  The daughter is concerned about the cost and states she will speak to her mother about it as well but she is in agreement to proceed with the process of getting this approved.  We discussed the insurance compliance requirements which includes using the machine at least 4 hours at night and also being seen in our office between 30 and 90 days after set up.  At this time she prefers not to schedule a follow-up appointment until they know if they will proceed with AutoPap.  She verbalized appreciation for the call and will watch for a call from [employer].   Referral for autopap sent to [employer]. Sleep study report sent to primary care (referring provider).

## 2023-01-03 NOTE — Telephone Encounter (Signed)
[employer] confirmed receipt of order.

## 2023-02-14 ENCOUNTER — Other Ambulatory Visit: Payer: Self-pay | Admitting: *Deleted

## 2023-02-14 ENCOUNTER — Telehealth: Payer: Self-pay | Admitting: Emergency Medicine

## 2023-02-14 DIAGNOSIS — E1159 Type 2 diabetes mellitus with other circulatory complications: Secondary | ICD-10-CM

## 2023-02-14 MED ORDER — VALSARTAN 160 MG PO TABS: ORAL_TABLET | ORAL | 3 refills | Status: DC

## 2023-02-14 NOTE — Telephone Encounter (Signed)
Prescription Request  02/14/2023  LOV: 10/21/2022  What is the name of the medication or equipment? valsartan (DIOVAN) 160 MG tablet   Have you contacted your pharmacy to request a refill? No   Which pharmacy would you like this sent to?     [id number] [place], [place] - [address] [place] [address] Phone: [phone] Fax: [phone]  Patient notified that their request is being sent to the clinical staff for review and that they should receive a response within 2 business days.   Please advise at Mobile There is no such number on file (mobile).

## 2023-02-14 NOTE — Telephone Encounter (Signed)
Medication sent to patient requested pharmacy  

## 2023-03-17 ENCOUNTER — Telehealth: Payer: Self-pay | Admitting: Emergency Medicine

## 2023-03-17 NOTE — Telephone Encounter (Signed)
Oh my gosh.... I am so sorry. My apologies. This message was meant for M. Alvy Bimler, MD.

## 2023-03-17 NOTE — Telephone Encounter (Signed)
Pt's daughter called to request a Transfer of Care for Pt -   From: M.J. Alvy Bimler, MD -  [facility]   To:  B. [place], MD - [facility]   Are both providers alright with this request?   Please advise.   Respectfully,  IC

## 2023-03-17 NOTE — Telephone Encounter (Signed)
Pt's daughter called to request a Transfer of Care for Pt -   From: M.J. Alvy Bimler, MD - [facility]   To:  B. [place], MD - [facility]   Are both providers alright with this request?   Please advise.   Respectfully,  IC

## 2023-03-17 NOTE — Telephone Encounter (Signed)
I am okay with this transfer of care.

## 2023-03-18 ENCOUNTER — Encounter: Payer: Self-pay | Admitting: Emergency Medicine

## 2023-03-18 ENCOUNTER — Ambulatory Visit (INDEPENDENT_AMBULATORY_CARE_PROVIDER_SITE_OTHER): Payer: 59

## 2023-03-18 ENCOUNTER — Ambulatory Visit (INDEPENDENT_AMBULATORY_CARE_PROVIDER_SITE_OTHER): Payer: 59 | Admitting: Emergency Medicine

## 2023-03-18 VITALS — BP 118/74 | HR 78 | Temp 98.6°F | Ht 66.0 in | Wt 215.6 lb

## 2023-03-18 DIAGNOSIS — R300 Dysuria: Secondary | ICD-10-CM | POA: Insufficient documentation

## 2023-03-18 DIAGNOSIS — S339XXA Sprain of unspecified parts of lumbar spine and pelvis, initial encounter: Secondary | ICD-10-CM

## 2023-03-18 DIAGNOSIS — M7918 Myalgia, other site: Secondary | ICD-10-CM | POA: Diagnosis not present

## 2023-03-18 MED ORDER — TRAMADOL HCL 50 MG PO TABS: 50.0000 mg | ORAL_TABLET | Freq: Three times a day (TID) | ORAL | 0 refills | Status: AC | PRN

## 2023-03-18 MED ORDER — CYCLOBENZAPRINE HCL 10 MG PO TABS: 10.0000 mg | ORAL_TABLET | Freq: Every day | ORAL | 0 refills | Status: DC

## 2023-03-18 MED ORDER — MELOXICAM 15 MG PO TABS: 15.0000 mg | ORAL_TABLET | Freq: Every day | ORAL | 0 refills | Status: AC

## 2023-03-18 NOTE — Progress Notes (Signed)
Tracie Burton 62 y.o.   Chief Complaint  Patient presents with   Back Pain    Back pain started 2 weeks ago. Lower back pain that moves to the front lower abdomin to her legs. She states woke up hurting.     HISTORY OF PRESENT ILLNESS: Acute problem visit today. This is a 62 y.o. female complaining of low back pain that started about 2 weeks ago Related to activities at work requiring physical force. Also complaining on burning with urination for couple days  HPI   Prior to Admission medications   Medication Sig Start Date End Date Taking? Authorizing Provider  ALPRAZolam Prudy Feeler) 0.5 MG tablet Take 1 tablet (0.5 mg total) by mouth 2 (two) times daily as needed for anxiety. 07/22/22  Yes Rashawna Scoles, Eilleen Kempf, MD  atorvastatin (LIPITOR) 20 MG tablet Take 1 tablet (20 mg total) by mouth daily. 12/18/22  Yes Georgina Quint, MD  metFORMIN (GLUCOPHAGE) 500 MG tablet Take 1 tablet (500 mg total) by mouth 2 (two) times daily with a meal. 07/22/22  Yes Saje Gallop, Eilleen Kempf, MD  mupirocin ointment (BACTROBAN) 2 % Apply 1 application topically 2 (two) times daily. 05/29/21  Yes Olive Bass, FNP  nystatin-triamcinolone [facility]) cream Apply 1 application topically 2 (two) times daily. 10/17/20  Yes Olive Bass, FNP  Semaglutide, 1 MG/DOSE, 4 MG/3ML SOPN Inject 1 mg as directed once a week. 06/25/22  Yes Georgina Quint, MD  valsartan (DIOVAN) 160 MG tablet TAKE 1 TABLET(160 MG) BY MOUTH DAILY 02/14/23  Yes Tanessa Tidd, Eilleen Kempf, MD  zolpidem (AMBIEN) 5 MG tablet Take 1 tablet (5 mg total) by mouth at bedtime as needed for sleep. 10/21/22  Yes Georgina Quint, MD    No Known Allergies  Patient Active Problem List   Diagnosis Date Noted   Suspected sleep apnea 10/21/2022   Chronic bilateral low back pain without sciatica 10/21/2022   Generalized body aches 10/21/2022   Situational anxiety 06/04/2022   Hypertension associated with diabetes (HCC)  09/24/2021    Past Medical History:  Diagnosis Date   Endometrial polyp    Hypertension    Type 2 diabetes mellitus treated with insulin (HCC)    followed by pcp    Past Surgical History:  Procedure Laterality Date   APPENDECTOMY  1982   CESAREAN SECTION  1981, 1989, 1994, 1998   CHOLECYSTECTOMY  1989    Social History   Socioeconomic History   Marital status: Single    Spouse name: Not on file   Number of children: 4   Years of education: 12   Highest education level: Not on file  Occupational History   Not on file  Tobacco Use   Smoking status: Former   Smokeless tobacco: Never  Vaping Use   Vaping status: Never Used  Substance and Sexual Activity   Alcohol use: Never   Drug use: Never   Sexual activity: Not Currently    Comment: 1st intercourse 62 yo-Fewer than 5 partners  Other Topics Concern   Not on file  Social History Narrative   Right handed   Drinks caffeine   One story home   Social Determinants of Health   Financial Resource Strain: Not on file  Food Insecurity: Not on file  Transportation Needs: Not on file  Physical Activity: Not on file  Stress: Not on file  Social Connections: Not on file  Intimate Partner Violence: Not on file    Family History  Problem  Relation Age of Onset   Breast cancer Mother 78   Diabetes Father    Sleep apnea Sister      Review of Systems  Constitutional: Negative.  Negative for chills and fever.  HENT: Negative.  Negative for congestion and sore throat.   Respiratory: Negative.  Negative for cough and shortness of breath.   Cardiovascular: Negative.  Negative for chest pain and palpitations.  Gastrointestinal:  Negative for abdominal pain, diarrhea, nausea and vomiting.  Genitourinary:  Positive for dysuria. Negative for hematuria.  Musculoskeletal:  Positive for back pain.  Skin: Negative.  Negative for rash.  Neurological: Negative.  Negative for dizziness and headaches.  All other systems reviewed  and are negative.   Vitals:   03/18/23 1417  BP: 118/74  Pulse: 78  Temp: 98.6 F (37 C)  SpO2: 95%    Physical Exam Vitals reviewed.  Constitutional:      Appearance: Normal appearance.  HENT:     Head: Normocephalic.  Eyes:     Extraocular Movements: Extraocular movements intact.  Cardiovascular:     Rate and Rhythm: Normal rate.  Pulmonary:     Effort: Pulmonary effort is normal.  Abdominal:     Palpations: Abdomen is soft.     Tenderness: There is no abdominal tenderness.  Musculoskeletal:     Lumbar back: Spasms and tenderness present. No bony tenderness. Decreased range of motion.  Skin:    General: Skin is warm and dry.  Neurological:     Mental Status: She is alert and oriented to person, place, and time.  Psychiatric:        Mood and Affect: Mood normal.        Behavior: Behavior normal.    DG Lumbar Spine 2-3 Views  Result Date: 03/18/2023 CLINICAL DATA:  Lumbar pain EXAM: LUMBAR SPINE - 2-3 VIEW COMPARISON:  October 21, 2022 FINDINGS: There are five non-rib bearing lumbar-type vertebral bodies. Osteopenia. Evaluation for alignment is limited by rotation and positioning. No definitive spondylolisthesis. There is no evidence for acute fracture or subluxation. Mild intervertebral disc space height loss with bulky multilevel endplate proliferative changes throughout the lumbar spine. Lumbar facet arthropathy, most pronounced at the lower lumbar spine. Atherosclerotic calcifications. IMPRESSION: Multilevel degenerative changes of the lumbar spine. Electronically Signed   By: Meda Klinefelter M.D.   On: 03/18/2023 16:07     ASSESSMENT & PLAN: Problem List Items Addressed This Visit       Musculoskeletal and Integument   Lumbosacral ligament sprain, initial encounter - Primary    Related to activities at work X-rays show multilevel degenerative changes of lumbar spine Recommend work restrictions Needs to rest and take medications as prescribed Recommend Tylenol  for mild to moderate pain and tramadol for moderate to severe pain Recommend to start muscle relaxant at bedtime, Flexeril 10 mg Take meloxicam 15 mg daily for 10 days Recommend orthopedic evaluation.  Referral placed today.      Relevant Orders   DG Lumbar Spine 2-3 Views (Completed)   Ambulatory referral to Orthopedic Surgery     Other   Dysuria    No red flag signs or symptoms Recommend urinalysis and urine culture today.      Relevant Orders   Urinalysis   Urine Culture   Musculoskeletal pain    Pain management discussed.  Essential for recovery. Recommend work restrictions Tylenol for mild to moderate pain and tramadol for moderate to severe pain      Relevant Medications   traMADol (  ULTRAM) 50 MG tablet   cyclobenzaprine (FLEXERIL) 10 MG tablet   meloxicam (MOBIC) 15 MG tablet   Other Relevant Orders   DG Lumbar Spine 2-3 Views (Completed)   Ambulatory referral to Orthopedic Surgery   Patient Instructions  Dolor de espalda agudo en los adultos Acute Back Pain, Adult El dolor de espalda agudo es repentino y por lo general no dura mucho tiempo. Se debe generalmente a una lesin de los msculos y tejidos de la espalda. La lesin puede ser el resultado de: Estiramiento en exceso o desgarro de un msculo, tendn o ligamento. Los ligamentos son tejidos que [employer]. Levantar algo de forma incorrecta puede producir un esguince de espalda. Desgaste (degeneracin) de los discos vertebrales. Los discos vertebrales son tejidos circulares que proporcionan amortiguacin entre los huesos de la columna vertebral (vrtebras). Movimientos de giro, como al practicar deportes o realizar trabajos de [place]. Un golpe en la espalda. Artritis. Es posible Producer, television/film/video un examen fsico, anlisis de laboratorio u otros estudios de diagnstico por imgenes para Veterinary surgeon causa del Engineer, mining. El dolor de espalda agudo generalmente desaparece con reposo y cuidados en la  casa. Siga estas instrucciones en su casa: Control del dolor, la rigidez y Architect los medicamentos de venta libre y los recetados solamente como se lo haya indicado el mdico. El tratamiento puede incluir medicamentos para Chief Technology Officer y la inflamacin que se toman por la boca o que se aplican sobre la piel, o relajantes musculares. El mdico puede recomendarle que se aplique hielo durante las primeras 24 a 48 horas despus del comienzo del Engineer, mining. Para hacer esto: Ponga el hielo en una bolsa plstica. Coloque una toalla entre la piel y Copy. Aplique el hielo durante 20 minutos, 2 o 3 veces por da. Retire el hielo si la piel se pone de color rojo brillante. Esto es [employer]. Si no puede sentir dolor, calor o fro, tiene un mayor riesgo de que se dae la zona. Si se lo indican, aplique calor en la zona afectada con la frecuencia que le haya indicado el mdico. Use la fuente de calor que el mdico le recomiende, como una compresa de calor hmedo o una almohadilla trmica. Coloque una toalla entre la piel y la fuente de Airline pilot. Aplique calor durante 20 a 30 minutos. Retire la fuente de calor si la piel se pone de color rojo brillante. Esto es especialmente importante si no puede sentir dolor, calor o fro. Corre un mayor riesgo de sufrir quemaduras. Actividad  No permanezca en la cama. Hacer reposo en la cama por ms de 1 a 2 das puede demorar su recuperacin. Mantenga una buena postura al sentarse y pararse. No se incline hacia adelante al sentarse ni se encorve al pararse. Si trabaja en un escritorio, sintese cerca de este para no tener que inclinarse. Mantenga el mentn hacia abajo. Mantenga el cuello hacia atrs y los codos flexionados en un ngulo de 90 grados (ngulo recto). Cuando conduzca, sintese elevado y cerca del volante. Agregue un apoyo para la espalda (lumbar) al asiento del automvil, si es necesario. Realice caminatas cortas en superficies planas tan pronto como le  sea posible. Trate de caminar un poco ms de Pharmacist, community. No se siente, conduzca o permanezca de pie en un mismo lugar durante ms de 30 minutos seguidos. Pararse o sentarse durante largos perodos de Contractor la espalda. No conduzca ni use maquinaria pesada mientras toma analgsicos recetados. Use tcnicas apropiadas  para levantar objetos. Cuando se inclina y Solicitor un [place], utilice posiciones que no sobrecarguen tanto la espalda: Flexione las rodillas. Mantenga la carga cerca del cuerpo. No se tuerza. Haga actividad fsica habitualmente como se lo haya indicado el mdico. Hacer ejercicios ayuda a que la espalda sane ms rpido y [place] a Automotive engineer las lesiones de la espalda al [employer] msculos fuertes y flexibles. Trabaje con un fisioterapeuta para crear un programa de ejercicios seguros, segn lo recomiende el mdico. Haga ejercicios como se lo haya indicado el fisioterapeuta. Estilo de vida Mantenga un peso saludable. El sobrepeso sobrecarga la espalda y hace que resulte difcil tener una buena [place]. Evite actividades o situaciones que lo hagan sentirse ansioso o estresado. El estrs y la ansiedad aumentan la tensin muscular y pueden empeorar el dolor de espalda. Aprenda formas de [employer] ansiedad y [place], como a travs del ejercicio. Instrucciones generales [place] sobre un colchn firme en una posicin cmoda. Intente acostarse de costado, con las rodillas ligeramente flexionadas. Si se recuesta [employer], coloque una almohada debajo de las rodillas. Mantenga la cabeza y el cuello en lnea recta con la columna vertebral (posicin neutra) cuando use equipos electrnicos como telfonos inteligentes o tablets. Para hacer esto: Levante el telfono inteligente o la tablet para Education officer, community de inclinar la cabeza o el cuello para mirar hacia abajo. Coloque el telfono inteligente o la tablet al nivel de su cara mientras mira la pantalla. Siga el plan de  tratamiento como se lo haya indicado el mdico. Esto puede incluir: Terapia cognitiva o conductual. Acupuntura o terapia de masajes. Yoga o meditacin. Comunquese con un mdico si: Siente un dolor que no se alivia con reposo o medicamentos. Siente mucho dolor que se extiende a las piernas o las nalgas. El dolor no mejora luego de 2 semanas. Siente dolor por la noche. Pierde peso sin proponrselo. Tiene fiebre o escalofros. Siente nuseas o vmitos. Siente dolor abdominal. Solicite ayuda de inmediato si: Tiene nuevos problemas para controlar la vejiga o los intestinos. Siente debilidad o adormecimiento inusuales en los brazos o en las piernas. Siente que va a desmayarse. Estos sntomas pueden representar un problema grave que constituye Radio broadcast assistant. No espere a ver si los sntomas desaparecen. Solicite atencin mdica de inmediato. Comunquese con el servicio de emergencias de su localidad (911 en los [place]). No conduzca por sus propios medios [employer]. Resumen El dolor de espalda agudo es repentino y por lo general no dura mucho tiempo. Use tcnicas apropiadas para levantar objetos. Cuando se inclina y levanta un [place], utilice posiciones que no sobrecarguen tanto la espalda. Tome los medicamentos de venta libre y los recetados solamente como se lo haya indicado el mdico, y aplquese calor o hielo segn las indicaciones. Esta informacin no tiene Theme park manager el consejo del mdico. Asegrese de hacerle al mdico cualquier pregunta que tenga. Document Revised: 07/26/2020 Document Reviewed: 07/26/2020 Elsevier Patient Education  2024 Elsevier Inc.    Edwina Barth, MD [facility]

## 2023-03-18 NOTE — Patient Instructions (Signed)
Dolor de espalda agudo en los adultos Acute Back Pain, Adult El dolor de espalda agudo es repentino y por lo general no dura mucho tiempo. Se debe generalmente a una lesin de los msculos y tejidos de la espalda. La lesin puede ser el resultado de: Estiramiento en exceso o desgarro de un msculo, tendn o ligamento. Los ligamentos son tejidos que [employer]. Levantar algo de forma incorrecta puede producir un esguince de espalda. Desgaste (degeneracin) de los discos vertebrales. Los discos vertebrales son tejidos circulares que proporcionan amortiguacin entre los huesos de la columna vertebral (vrtebras). Movimientos de giro, como al practicar deportes o realizar trabajos de [place]. Un golpe en la espalda. Artritis. Es posible Producer, television/film/video un examen fsico, anlisis de laboratorio u otros estudios de diagnstico por imgenes para Veterinary surgeon causa del Engineer, mining. El dolor de espalda agudo generalmente desaparece con reposo y cuidados en la casa. Siga estas instrucciones en su casa: Control del dolor, la rigidez y Architect los medicamentos de venta libre y los recetados solamente como se lo haya indicado el mdico. El tratamiento puede incluir medicamentos para Chief Technology Officer y la inflamacin que se toman por la boca o que se aplican sobre la piel, o relajantes musculares. El mdico puede recomendarle que se aplique hielo durante las primeras 24 a 48 horas despus del comienzo del Engineer, mining. Para hacer esto: Ponga el hielo en una bolsa plstica. Coloque una toalla entre la piel y Copy. Aplique el hielo durante 20 minutos, 2 o 3 veces por da. Retire el hielo si la piel se pone de color rojo brillante. Esto es [employer]. Si no puede sentir dolor, calor o fro, tiene un mayor riesgo de que se dae la zona. Si se lo indican, aplique calor en la zona afectada con la frecuencia que le haya indicado el mdico. Use la fuente de calor que el mdico le recomiende, como una compresa de  calor hmedo o una almohadilla trmica. Coloque una toalla entre la piel y la fuente de Airline pilot. Aplique calor durante 20 a 30 minutos. Retire la fuente de calor si la piel se pone de color rojo brillante. Esto es especialmente importante si no puede sentir dolor, calor o fro. Corre un mayor riesgo de sufrir quemaduras. Actividad  No permanezca en la cama. Hacer reposo en la cama por ms de 1 a 2 das puede demorar su recuperacin. Mantenga una buena postura al sentarse y pararse. No se incline hacia adelante al sentarse ni se encorve al pararse. Si trabaja en un escritorio, sintese cerca de este para no tener que inclinarse. Mantenga el mentn hacia abajo. Mantenga el cuello hacia atrs y los codos flexionados en un ngulo de 90 grados (ngulo recto). Cuando conduzca, sintese elevado y cerca del volante. Agregue un apoyo para la espalda (lumbar) al asiento del automvil, si es necesario. Realice caminatas cortas en superficies planas tan pronto como le sea posible. Trate de caminar un poco ms de Pharmacist, community. No se siente, conduzca o permanezca de pie en un mismo lugar durante ms de 30 minutos seguidos. Pararse o sentarse durante largos perodos de Contractor la espalda. No conduzca ni use maquinaria pesada mientras toma analgsicos recetados. Use tcnicas apropiadas para levantar objetos. Cuando se inclina y Solicitor un [place], utilice posiciones que no sobrecarguen tanto la espalda: Flexione las rodillas. Mantenga la carga cerca del cuerpo. No se tuerza. Haga actividad fsica habitualmente como se lo haya indicado el mdico. Hacer ejercicios ayuda  a que la espalda sane ms rpido y [place] a Automotive engineer las lesiones de la espalda al [employer] msculos fuertes y flexibles. Trabaje con un fisioterapeuta para crear un programa de ejercicios seguros, segn lo recomiende el mdico. Haga ejercicios como se lo haya indicado el fisioterapeuta. Estilo de vida Mantenga un peso saludable.  El sobrepeso sobrecarga la espalda y hace que resulte difcil tener una buena [place]. Evite actividades o situaciones que lo hagan sentirse ansioso o estresado. El estrs y la ansiedad aumentan la tensin muscular y pueden empeorar el dolor de espalda. Aprenda formas de [employer] ansiedad y [place], como a travs del ejercicio. Instrucciones generales [place] sobre un colchn firme en una posicin cmoda. Intente acostarse de costado, con las rodillas ligeramente flexionadas. Si se recuesta [employer], coloque una almohada debajo de las rodillas. Mantenga la cabeza y el cuello en lnea recta con la columna vertebral (posicin neutra) cuando use equipos electrnicos como telfonos inteligentes o tablets. Para hacer esto: Levante el telfono inteligente o la tablet para Education officer, community de inclinar la cabeza o el cuello para mirar hacia abajo. Coloque el telfono inteligente o la tablet al nivel de su cara mientras mira la pantalla. Siga el plan de tratamiento como se lo haya indicado el mdico. Esto puede incluir: Terapia cognitiva o conductual. Acupuntura o terapia de masajes. Yoga o meditacin. Comunquese con un mdico si: Siente un dolor que no se alivia con reposo o medicamentos. Siente mucho dolor que se extiende a las piernas o las nalgas. El dolor no mejora luego de 2 semanas. Siente dolor por la noche. Pierde peso sin proponrselo. Tiene fiebre o escalofros. Siente nuseas o vmitos. Siente dolor abdominal. Solicite ayuda de inmediato si: Tiene nuevos problemas para controlar la vejiga o los intestinos. Siente debilidad o adormecimiento inusuales en los brazos o en las piernas. Siente que va a desmayarse. Estos sntomas pueden representar un problema grave que constituye Radio broadcast assistant. No espere a ver si los sntomas desaparecen. Solicite atencin mdica de inmediato. Comunquese con el servicio de emergencias de su localidad (911 en los [place]). No conduzca por sus  propios medios [employer]. Resumen El dolor de espalda agudo es repentino y por lo general no dura mucho tiempo. Use tcnicas apropiadas para levantar objetos. Cuando se inclina y levanta un [place], utilice posiciones que no sobrecarguen tanto la espalda. Tome los medicamentos de venta libre y los recetados solamente como se lo haya indicado el mdico, y aplquese calor o hielo segn las indicaciones. Esta informacin no tiene Theme park manager el consejo del mdico. Asegrese de hacerle al mdico cualquier pregunta que tenga. Document Revised: 07/26/2020 Document Reviewed: 07/26/2020 Elsevier Patient Education  2024 [employer].

## 2023-03-18 NOTE — Assessment & Plan Note (Signed)
No red flag signs or symptoms Recommend urinalysis and urine culture today.

## 2023-03-18 NOTE — Telephone Encounter (Signed)
Fine with me. Thanks, BJ

## 2023-03-18 NOTE — Assessment & Plan Note (Signed)
Pain management discussed.  Essential for recovery. Recommend work restrictions Tylenol for mild to moderate pain and tramadol for moderate to severe pain

## 2023-03-18 NOTE — Assessment & Plan Note (Addendum)
Related to activities at work X-rays show multilevel degenerative changes of lumbar spine Recommend work restrictions Needs to rest and take medications as prescribed Recommend Tylenol for mild to moderate pain and tramadol for moderate to severe pain Recommend to start muscle relaxant at bedtime, Flexeril 10 mg Take meloxicam 15 mg daily for 10 days Recommend orthopedic evaluation.  Referral placed today.

## 2023-03-19 LAB — URINALYSIS, ROUTINE W REFLEX MICROSCOPIC
Bilirubin Urine: NEGATIVE
Ketones, ur: NEGATIVE
Nitrite: NEGATIVE
Specific Gravity, Urine: >=1.03 — AB (ref 1.000–1.030)
Total Protein, Urine: NEGATIVE
Urine Glucose: NEGATIVE
Urobilinogen, UA: 0.2 (ref 0.0–1.0)
pH: 6 (ref 5.0–8.0)

## 2023-03-19 LAB — URINE CULTURE

## 2023-03-19 NOTE — Telephone Encounter (Signed)
Called Pt to schedule TOC.  Left VM for Pt to call us back.

## 2023-03-24 ENCOUNTER — Other Ambulatory Visit: Payer: Self-pay | Admitting: Emergency Medicine

## 2023-03-24 DIAGNOSIS — M7918 Myalgia, other site: Secondary | ICD-10-CM

## 2023-03-24 MED ORDER — CYCLOBENZAPRINE HCL 10 MG PO TABS: 10.0000 mg | ORAL_TABLET | Freq: Every day | ORAL | 0 refills | Status: AC

## 2023-03-24 NOTE — Telephone Encounter (Signed)
Everything looks ok. New prescription for cyclobenzaprine sent today.

## 2023-04-01 ENCOUNTER — Encounter: Payer: Self-pay | Admitting: Physical Medicine and Rehabilitation

## 2023-04-01 ENCOUNTER — Ambulatory Visit: Payer: 59 | Admitting: Physical Medicine and Rehabilitation

## 2023-04-01 DIAGNOSIS — M5441 Lumbago with sciatica, right side: Secondary | ICD-10-CM

## 2023-04-01 DIAGNOSIS — M5412 Radiculopathy, cervical region: Secondary | ICD-10-CM

## 2023-04-01 DIAGNOSIS — M47816 Spondylosis without myelopathy or radiculopathy, lumbar region: Secondary | ICD-10-CM

## 2023-04-01 DIAGNOSIS — M5442 Lumbago with sciatica, left side: Secondary | ICD-10-CM

## 2023-04-01 DIAGNOSIS — M5416 Radiculopathy, lumbar region: Secondary | ICD-10-CM

## 2023-04-01 DIAGNOSIS — G8929 Other chronic pain: Secondary | ICD-10-CM

## 2023-04-01 DIAGNOSIS — M7918 Myalgia, other site: Secondary | ICD-10-CM

## 2023-04-01 MED ORDER — MELOXICAM 15 MG PO TABS: 15.0000 mg | ORAL_TABLET | Freq: Every day | ORAL | 0 refills | Status: DC

## 2023-04-01 NOTE — Progress Notes (Signed)
Tracie Burton - 62 y.o. female MRN [id number]  Date of birth: [birth year]  Office Visit Note: Visit Date: 04/01/2023 PCP: Georgina Quint, MD Referred by: Georgina Quint, *  Subjective: Chief Complaint  Patient presents with   Lower Back - Pain   Neck - Pain   HPI: Tracie Burton is a 62 y.o. female who comes in today per the request of Dr. Derinda Sis for evaluation of chronic, worsening and severe bilateral lower back pain radiating to buttocks and down lateral legs to feet, right greater than left. Also reports numbness/tingling to legs. Spanish interpreter at bedside during our visit today. Pain ongoing for several months, worsens with movement and activity. States her pain becomes severe when working at [employer]. She describes her pain as pinching sensation, currently rates as 8 out of 10. Some relief of pain with home exercise regimen, rest and use of medications. Significant relief of pain with Meloxicam. States she was not able to take Flexeril due to work schedule. No history of formal physical therapy. Recent radiographs of lumbar spine exhibit multi level degenerative changes and facet arthropathy to lower lumbar spine. No history of lumbar surgery/injections.   Incidentally, she also mentioned chronic, worsening and severe bilateral neck pain radiating to shoulders and down arms. Also reports numbness/tingling to bilateral arms. Pain ongoing for several years, worsened over the last month. Her pain is severe with movement, activity and laying flat. She describes pain as sore and aching, currently rates as 7 out of 10. Some relief of pain with rest and medications. No history of formal physical therapy for neck issues. Cervical spine radiographs from 2021 exhibit extensive degenerative change greatest at C4-C5, C5-C6 and C6-C7.   Patient denies focal weakness. No recent trauma or falls.       Review of Systems  Musculoskeletal:  Positive  for back pain, myalgias and neck pain.  Neurological:  Positive for tingling. Negative for focal weakness and weakness.  All other systems reviewed and are negative.  Otherwise per HPI.  Assessment & Plan: Visit Diagnoses:    ICD-10-CM   1. Chronic bilateral low back pain with bilateral sciatica  M54.42 MR LUMBAR SPINE WO CONTRAST   M54.41 Ambulatory referral to Physical Therapy   G89.29     2. Lumbar radiculopathy  M54.16 MR LUMBAR SPINE WO CONTRAST    Ambulatory referral to Physical Therapy    3. Facet arthropathy, lumbar  M47.816 MR LUMBAR SPINE WO CONTRAST    Ambulatory referral to Physical Therapy    4. Radiculopathy, cervical region  M54.12 Ambulatory referral to Physical Therapy    5. Myofascial pain syndrome  M79.18 Ambulatory referral to Physical Therapy       Plan: Findings:  1. Chronic, worsening and severe bilateral lower back pain radiating to buttocks and down lateral legs to feet, right greater than left. Lower back radiating to legs is most severe issue at this time. She continues to have severe pain despite good conservative therapies such as home exercise regimen, rest and use of medications. Recent lumbar radiographs do show significant facet arthropathy to lower lumbar spine. We discussed treatment plan in detail today. Next step is to place order for lumbar MRI imaging. Depending on results of imaging we discussed possibility of performing lumbar injections. I also placed order for short course of formal physical therapy with a focus on core strengthening and posture. I did refill Meloxicam for her to continue. She has no questions at this time. I  will see her back for lumbar MRI review.   2. Chronic, worsening and severe bilateral neck pain radiating to shoulders and down arms. Paresthesias down bilateral arms. Patient continues to have severe pain despite good conservative therapies such as home exercise regimen, rest and use of medications. Patients clinical  presentation and exam are consistent with myofascial pain syndrome. Tenderness noted to bilateral levator scapulae and trapezius regions upon exam today. I also placed PT order for her neck, recommend manual treatments and possible dry needling. Should her pain continue would consider obtaining cervical MRI imaging. No red flag symptoms noted upon exam today.     Meds & Orders:  Meds ordered this encounter  Medications   meloxicam (MOBIC) 15 MG tablet    Sig: Take 1 tablet (15 mg total) by mouth daily.    Dispense:  30 tablet    Refill:  0    Orders Placed This Encounter  Procedures   MR LUMBAR SPINE WO CONTRAST   Ambulatory referral to Physical Therapy    Follow-up: Return for Lumbar MRI review.   Procedures: No procedures performed      Clinical History: No specialty comments available.   She reports that she has quit smoking. She has never used smokeless tobacco.  Recent Labs    06/04/22 1559 10/21/22 1534  HGBA1C 5.8* 6.7*    Objective:  VS:  HT:    WT:   BMI:     BP:   HR: bpm  TEMP: ( )  RESP:  Physical Exam Vitals and nursing note reviewed.  HENT:     Head: Normocephalic and atraumatic.     Right Ear: External ear normal.     Left Ear: External ear normal.     Nose: Nose normal.     Mouth/Throat:     Mouth: Mucous membranes are moist.  Eyes:     Pupils: Pupils are equal, round, and reactive to light.  Cardiovascular:     Rate and Rhythm: Normal rate.     Pulses: Normal pulses.  Pulmonary:     Effort: Pulmonary effort is normal.  Abdominal:     General: Abdomen is flat. There is no distension.  Musculoskeletal:        General: Tenderness present.     Cervical back: Tenderness present.     Comments: Patient rises from seated position to standing without difficulty. Good lumbar range of motion. No pain noted with facet loading. 5/5 strength noted with bilateral hip flexion, knee flexion/extension, ankle dorsiflexion/plantarflexion and EHL. No clonus  noted bilaterally. No pain upon palpation of greater trochanters. No pain with internal/external rotation of bilateral hips. Sensation intact bilaterally. Negative slump test bilaterally. Ambulates without aid, gait steady.   No discomfort noted with flexion, extension and side-to-side rotation. Patient has good strength in the upper extremities including 5 out of 5 strength in wrist extension, long finger flexion and APB. Shoulder range of motion is full bilaterally without any sign of impingement. There is no atrophy of the hands intrinsically. Sensation intact bilaterally. Tenderness noted to bilateral levator scapulae and trapezius muscles. Negative Hoffman's sign. Negative Spurling's sign.       Skin:    General: Skin is warm and dry.     Capillary Refill: Capillary refill takes less than 2 seconds.  Neurological:     General: No focal deficit present.     Mental Status: She is alert and oriented to person, place, and time.  Psychiatric:  Mood and Affect: Mood normal.        Behavior: Behavior normal.     Ortho Exam  Imaging: No results found.  Past Medical/Family/Surgical/Social History: Medications & Allergies reviewed per EMR, new medications updated. Patient Active Problem List   Diagnosis Date Noted   Dysuria 03/18/2023   Musculoskeletal pain 03/18/2023   Lumbosacral ligament sprain, initial encounter 03/18/2023   Suspected sleep apnea 10/21/2022   Chronic bilateral low back pain without sciatica 10/21/2022   Generalized body aches 10/21/2022   Situational anxiety 06/04/2022   Hypertension associated with diabetes (HCC) 09/24/2021   Past Medical History:  Diagnosis Date   Endometrial polyp    Hypertension    Type 2 diabetes mellitus treated with insulin (HCC)    followed by pcp   Family History  Problem Relation Age of Onset   Breast cancer Mother 63   Diabetes Father    Sleep apnea Sister    Past Surgical History:  Procedure Laterality Date    APPENDECTOMY  61   CESAREAN SECTION  1981, 1989, 1994, 1998   CHOLECYSTECTOMY  1989   Social History   Occupational History   Not on file  Tobacco Use   Smoking status: Former   Smokeless tobacco: Never  Vaping Use   Vaping status: Never Used  Substance and Sexual Activity   Alcohol use: Never   Drug use: Never   Sexual activity: Not Currently    Comment: 1st intercourse 62 yo-Fewer than 5 partners

## 2023-04-01 NOTE — Progress Notes (Signed)
Lower back and neck pain.  She stated her L side is worse.  Neck pain with pain in Bil arms.  She stated the pain came suddenly 2 months ago.  She stated its more Bone pain.  She has been taken tramadol and melxociam she stated the meloxicam took the pain away completely.

## 2023-04-21 ENCOUNTER — Ambulatory Visit: Payer: 59 | Admitting: Physical Therapy

## 2023-04-25 ENCOUNTER — Other Ambulatory Visit: Payer: Self-pay | Admitting: Physical Medicine and Rehabilitation

## 2023-04-28 ENCOUNTER — Ambulatory Visit: Payer: 59 | Admitting: Physical Therapy

## 2023-05-06 ENCOUNTER — Other Ambulatory Visit: Payer: 59

## 2023-07-22 ENCOUNTER — Other Ambulatory Visit: Payer: Self-pay | Admitting: Emergency Medicine

## 2023-07-22 DIAGNOSIS — E1159 Type 2 diabetes mellitus with other circulatory complications: Secondary | ICD-10-CM

## 2024-02-23 ENCOUNTER — Encounter: Payer: Self-pay | Admitting: Radiology

## 2024-02-26 ENCOUNTER — Other Ambulatory Visit: Payer: Self-pay | Admitting: Emergency Medicine

## 2024-02-26 DIAGNOSIS — E1159 Type 2 diabetes mellitus with other circulatory complications: Secondary | ICD-10-CM
